# Patient Record
Sex: Female | Born: 1970 | Race: Black or African American | Hispanic: No | Marital: Single | Smoking: Former smoker
Health system: Southern US, Community
[De-identification: ages and names within clinical notes are randomized; demographics above are authoritative.]

## PROBLEM LIST (undated history)

## (undated) DIAGNOSIS — M216X9 Other acquired deformities of unspecified foot: Secondary | ICD-10-CM

## (undated) DIAGNOSIS — Z8489 Family history of other specified conditions: Secondary | ICD-10-CM

## (undated) DIAGNOSIS — J302 Other seasonal allergic rhinitis: Secondary | ICD-10-CM

## (undated) DIAGNOSIS — I10 Essential (primary) hypertension: Secondary | ICD-10-CM

## (undated) DIAGNOSIS — K219 Gastro-esophageal reflux disease without esophagitis: Secondary | ICD-10-CM

## (undated) DIAGNOSIS — R51 Headache: Secondary | ICD-10-CM

## (undated) DIAGNOSIS — G4730 Sleep apnea, unspecified: Secondary | ICD-10-CM

## (undated) DIAGNOSIS — T883XXA Malignant hyperthermia due to anesthesia, initial encounter: Secondary | ICD-10-CM

## (undated) DIAGNOSIS — F329 Major depressive disorder, single episode, unspecified: Secondary | ICD-10-CM

## (undated) DIAGNOSIS — T4145XA Adverse effect of unspecified anesthetic, initial encounter: Secondary | ICD-10-CM

## (undated) DIAGNOSIS — D219 Benign neoplasm of connective and other soft tissue, unspecified: Secondary | ICD-10-CM

## (undated) DIAGNOSIS — F32A Depression, unspecified: Secondary | ICD-10-CM

## (undated) DIAGNOSIS — M1990 Unspecified osteoarthritis, unspecified site: Secondary | ICD-10-CM

## (undated) DIAGNOSIS — T8859XA Other complications of anesthesia, initial encounter: Secondary | ICD-10-CM

## (undated) DIAGNOSIS — F419 Anxiety disorder, unspecified: Secondary | ICD-10-CM

## (undated) DIAGNOSIS — E039 Hypothyroidism, unspecified: Secondary | ICD-10-CM

## (undated) HISTORY — PX: KNEE SURGERY: SHX244

## (undated) HISTORY — DX: Other acquired deformities of unspecified foot: M21.6X9

---

## 2009-12-26 ENCOUNTER — Encounter
Admission: RE | Admit: 2009-12-26 | Discharge: 2009-12-26 | Payer: Self-pay | Source: Home / Self Care | Attending: Endocrinology | Admitting: Endocrinology

## 2010-04-18 ENCOUNTER — Emergency Department (HOSPITAL_BASED_OUTPATIENT_CLINIC_OR_DEPARTMENT_OTHER)
Admission: EM | Admit: 2010-04-18 | Discharge: 2010-04-18 | Payer: Self-pay | Source: Home / Self Care | Admitting: Emergency Medicine

## 2010-04-18 LAB — BASIC METABOLIC PANEL
Calcium: 9.1 mg/dL (ref 8.4–10.5)
GFR calc Af Amer: >60 mL/min (ref >60)
GFR calc non Af Amer: >60 mL/min (ref >60)
Potassium: 3.8 mEq/L (ref 3.5–5.1)
Sodium: 140 mEq/L (ref 135–145)

## 2010-04-18 LAB — CBC
Platelets: 336 10*3/uL (ref 150–400)
RDW: 15.2 % (ref 11.5–15.5)
WBC: 5.7 10*3/uL (ref 4.0–10.5)

## 2010-04-18 LAB — POCT CARDIAC MARKERS
CKMB, poc: <1 ng/mL — ABNORMAL LOW (ref 1.0–8.0)
Troponin i, poc: <0.05 ng/mL (ref 0.00–0.09)
Troponin i, poc: <0.05 ng/mL (ref 0.00–0.09)

## 2010-04-18 LAB — DIFFERENTIAL
Basophils Absolute: 0 10*3/uL (ref 0.0–0.1)
Basophils Relative: 0 % (ref 0–1)
Eosinophils Absolute: 0.1 10*3/uL (ref 0.0–0.7)
Eosinophils Relative: 2 % (ref 0–5)
Lymphocytes Relative: 40 % (ref 12–46)

## 2010-12-01 ENCOUNTER — Encounter: Payer: Self-pay | Admitting: *Deleted

## 2010-12-01 ENCOUNTER — Emergency Department (HOSPITAL_BASED_OUTPATIENT_CLINIC_OR_DEPARTMENT_OTHER)
Admission: EM | Admit: 2010-12-01 | Discharge: 2010-12-02 | Disposition: A | Discharge status: Home / Self Care | Payer: BC Managed Care – PPO | Attending: Emergency Medicine | Admitting: Emergency Medicine

## 2010-12-01 DIAGNOSIS — I10 Essential (primary) hypertension: Secondary | ICD-10-CM | POA: Insufficient documentation

## 2010-12-01 DIAGNOSIS — M79609 Pain in unspecified limb: Secondary | ICD-10-CM | POA: Insufficient documentation

## 2010-12-01 DIAGNOSIS — M549 Dorsalgia, unspecified: Secondary | ICD-10-CM | POA: Insufficient documentation

## 2010-12-01 DIAGNOSIS — E119 Type 2 diabetes mellitus without complications: Secondary | ICD-10-CM | POA: Insufficient documentation

## 2010-12-01 DIAGNOSIS — G4730 Sleep apnea, unspecified: Secondary | ICD-10-CM | POA: Insufficient documentation

## 2010-12-01 DIAGNOSIS — F341 Dysthymic disorder: Secondary | ICD-10-CM | POA: Insufficient documentation

## 2010-12-01 DIAGNOSIS — M5430 Sciatica, unspecified side: Secondary | ICD-10-CM | POA: Insufficient documentation

## 2010-12-01 DIAGNOSIS — M79606 Pain in leg, unspecified: Secondary | ICD-10-CM

## 2010-12-01 HISTORY — DX: Essential (primary) hypertension: I10

## 2010-12-01 HISTORY — DX: Depression, unspecified: F32.A

## 2010-12-01 HISTORY — DX: Major depressive disorder, single episode, unspecified: F32.9

## 2010-12-01 HISTORY — DX: Sleep apnea, unspecified: G47.30

## 2010-12-01 HISTORY — DX: Anxiety disorder, unspecified: F41.9

## 2010-12-01 NOTE — ED Notes (Signed)
Pt states she has a hx of problems with her left knee and leg, but has now developed pain in her right leg from the knee down x 1 week

## 2010-12-02 ENCOUNTER — Emergency Department (INDEPENDENT_AMBULATORY_CARE_PROVIDER_SITE_OTHER): Payer: BC Managed Care – PPO

## 2010-12-02 DIAGNOSIS — M545 Low back pain, unspecified: Secondary | ICD-10-CM

## 2010-12-02 DIAGNOSIS — M79609 Pain in unspecified limb: Secondary | ICD-10-CM

## 2010-12-02 MED ORDER — OXYCODONE-ACETAMINOPHEN 5-325 MG PO TABS: 1.0000 | ORAL_TABLET | Freq: Four times a day (QID) | ORAL | Status: AC | PRN

## 2010-12-02 MED ORDER — OXYCODONE-ACETAMINOPHEN 5-325 MG PO TABS
2.0000 | ORAL_TABLET | Freq: Once | ORAL | Status: AC
  Administered 2010-12-02: 2 via ORAL
  Filled 2010-12-02: qty 2

## 2010-12-02 NOTE — ED Provider Notes (Signed)
History     CSN: [id number] Arrival date & time: 12/01/2010 10:37 PM  Chief Complaint  Patient presents with  . Leg Pain   Patient is a 40 y.o. female presenting with leg pain. The history is provided by the patient.  Leg Pain  Incident onset: c/o right hip and leg pain for past several weeks. There was no injury mechanism. The pain is severe. The pain has been constant since onset. Pertinent negatives include no numbness and no loss of sensation.  pt c/o radiating pain from right low back/ buttock area down right post lat hip to lower leg for past 3 weeks. Constant. Worse  w certain positions.  Denies hx ddd. No trauma, fall or injury. No numbness/weakness. No acute or abrupt change in sumptoms but states pain not controlled w home meds. No fever or chills.  No bowel or bladder c/o.   Past Medical History  Diagnosis Date  . Diabetes mellitus   . Hypertension   . Sleep apnea   . Anxiety   . Depression     Past Surgical History  Procedure Date  . Knee surgery     History reviewed. No pertinent family history.  History  Substance Use Topics  . Smoking status: Never Smoker   . Smokeless tobacco: Not on file  . Alcohol Use: No    OB History    Grav Para Term Preterm Abortions TAB SAB Ect Mult Living                  Review of Systems  Constitutional: Negative for fever.  HENT: Negative for neck pain.   Eyes: Negative for redness.  Respiratory: Negative for shortness of breath.   Cardiovascular: Negative for chest pain.  Gastrointestinal: Negative for abdominal pain.  Genitourinary: Negative for dysuria.  Skin: Negative for rash.  Neurological: Negative for numbness and headaches.  Hematological: Does not bruise/bleed easily.  Psychiatric/Behavioral: Negative for behavioral problems.    Physical Exam  BP 129/75  Pulse 78  Temp(Src) 98.5 F (36.9 C) (Oral)  Resp 20  Ht 5\' 6"  (1.676 m)  Wt 280 lb (127.007 kg)  BMI 45.19 kg/m2  SpO2 100%  LMP  12/01/2010  Physical Exam  Nursing note and vitals reviewed. Constitutional: She is oriented to person, place, and time. She appears well-developed and well-nourished. No distress.  Eyes: Conjunctivae are normal. No scleral icterus.  Neck: Neck supple. No tracheal deviation present.  Cardiovascular: Normal rate.   Pulmonary/Chest: Effort normal. No respiratory distress.  Abdominal: Soft. Normal appearance and bowel sounds are normal. She exhibits no distension. There is no tenderness. There is no rebound and no guarding.       obese  Genitourinary:       No cva tenderness.  Musculoskeletal: She exhibits no edema.       Spine non tender. Pain/tenderness in area right sciatic notch. Straight leg raise pos on right. Distal pulses palp. Good rom at hip and knee without pain. No knee effusion. No skin changes or erythema. No sts. No calf swelling or tenderness.   Neurological: She is alert and oriented to person, place, and time. She has normal reflexes.       Motor intact. Normal reflexes  Skin: Skin is warm and dry. No rash noted.  Psychiatric: She has a normal mood and affect.    ED Course  Procedures  MDM   Pt states took no pain meds pta. Does not have to drive. Percocet po. Xr.  Dg Lumbar Spine Complete  12/02/2010  *RADIOLOGY REPORT*  Clinical Data: Low back pain  LUMBAR SPINE - COMPLETE 4+ VIEW  Comparison: None.  Findings: The imaged vertebral bodies and inter-vertebral disc spaces are maintained. No displaced acute fracture or dislocation identified.   The para-vertebral and overlying soft tissues are within normal limits.  IMPRESSION: No acute osseous abnormality.,  Original Report Authenticated By: Waneta Martins, M.D.   Dg Hip Complete Right  12/02/2010  *RADIOLOGY REPORT*  Clinical Data: Right leg pain.  RIGHT HIP - COMPLETE 2+ VIEW  Comparison: None.  Findings: No displaced acute fracture or dislocation identified. No aggressive appearing osseous lesion.  IMPRESSION:  No acute osseous abnormality.  Original Report Authenticated By: Waneta Martins, M.D.    Recheck pt comfortable.    Suzi Roots, MD 12/02/10 [phone]

## 2011-08-26 ENCOUNTER — Other Ambulatory Visit: Payer: Self-pay | Admitting: Obstetrics and Gynecology

## 2011-08-27 ENCOUNTER — Encounter (HOSPITAL_COMMUNITY): Payer: Self-pay

## 2011-08-28 ENCOUNTER — Encounter (HOSPITAL_COMMUNITY)
Admission: RE | Admit: 2011-08-28 | Discharge: 2011-08-28 | Disposition: A | Discharge status: Home / Self Care | Payer: BC Managed Care – PPO | Source: Ambulatory Visit | Attending: Obstetrics and Gynecology | Admitting: Obstetrics and Gynecology

## 2011-08-28 ENCOUNTER — Other Ambulatory Visit: Payer: Self-pay

## 2011-08-28 ENCOUNTER — Encounter (HOSPITAL_COMMUNITY): Payer: Self-pay

## 2011-08-28 HISTORY — DX: Adverse effect of unspecified anesthetic, initial encounter: T41.45XA

## 2011-08-28 HISTORY — DX: Other complications of anesthesia, initial encounter: T88.59XA

## 2011-08-28 HISTORY — DX: Malignant hyperthermia due to anesthesia, initial encounter: T88.3XXA

## 2011-08-28 HISTORY — DX: Family history of other specified conditions: Z84.89

## 2011-08-28 LAB — CBC
HCT: 37.7 % (ref 36.0–46.0)
Hemoglobin: 11.7 g/dL — ABNORMAL LOW (ref 12.0–15.0)
MCH: 24.8 pg — ABNORMAL LOW (ref 26.0–34.0)
MCHC: 31 g/dL (ref 30.0–36.0)
RDW: 14.1 % (ref 11.5–15.5)

## 2011-08-28 LAB — BASIC METABOLIC PANEL
BUN: 6 mg/dL (ref 6–23)
Calcium: 9.4 mg/dL (ref 8.4–10.5)
GFR calc Af Amer: >90 mL/min (ref >90)
GFR calc non Af Amer: >90 mL/min (ref >90)
Glucose, Bld: 102 mg/dL — ABNORMAL HIGH (ref 70–99)

## 2011-08-28 NOTE — Patient Instructions (Signed)
YOUR PROCEDURE IS SCHEDULED ON:09/05/11  ENTER THROUGH THE MAIN ENTRANCE OF [facility] AT:8am  USE DESK PHONE AND DIAL [id number] TO INFORM us OF YOUR ARRIVAL  CALL [phone] IF YOU HAVE ANY QUESTIONS OR PROBLEMS PRIOR TO YOUR ARRIVAL.  REMEMBER: DO NOT EAT OR DRINK AFTER MIDNIGHT :Thursday   YOU MAY BRUSH YOUR TEETH THE MORNING OF SURGERY   TAKE THESE MEDICINES THE DAY OF SURGERY WITH SIP OF WATER:see your list for meds checked in red---take those   DO NOT WEAR JEWELRY, EYE MAKEUP, LIPSTICK OR DARK FINGERNAIL POLISH DO NOT WEAR LOTIONS  DO NOT SHAVE FOR 48 HOURS PRIOR TO SURGERY  YOU WILL NOT BE ALLOWED TO DRIVE YOURSELF HOME.  NAME OF DRIVER:Anita Rodgers

## 2011-09-05 ENCOUNTER — Encounter (HOSPITAL_COMMUNITY): Payer: Self-pay | Admitting: Certified Registered"

## 2011-09-05 ENCOUNTER — Encounter (HOSPITAL_COMMUNITY): Payer: Self-pay | Admitting: *Deleted

## 2011-09-05 ENCOUNTER — Encounter (HOSPITAL_COMMUNITY): Payer: Self-pay

## 2011-09-05 ENCOUNTER — Ambulatory Visit (HOSPITAL_COMMUNITY): Payer: BC Managed Care – PPO | Admitting: Certified Registered"

## 2011-09-05 ENCOUNTER — Ambulatory Visit (HOSPITAL_COMMUNITY)
Admission: RE | Admit: 2011-09-05 | Discharge: 2011-09-05 | Disposition: A | Discharge status: Home / Self Care | Payer: BC Managed Care – PPO | Source: Ambulatory Visit | Attending: Obstetrics and Gynecology | Admitting: Obstetrics and Gynecology

## 2011-09-05 ENCOUNTER — Encounter (HOSPITAL_COMMUNITY)
Admission: RE | Disposition: A | Discharge status: Home / Self Care | Payer: Self-pay | Source: Ambulatory Visit | Attending: Obstetrics and Gynecology

## 2011-09-05 DIAGNOSIS — Z01812 Encounter for preprocedural laboratory examination: Secondary | ICD-10-CM | POA: Insufficient documentation

## 2011-09-05 DIAGNOSIS — N840 Polyp of corpus uteri: Secondary | ICD-10-CM | POA: Insufficient documentation

## 2011-09-05 DIAGNOSIS — Z01818 Encounter for other preprocedural examination: Secondary | ICD-10-CM | POA: Insufficient documentation

## 2011-09-05 DIAGNOSIS — N920 Excessive and frequent menstruation with regular cycle: Secondary | ICD-10-CM

## 2011-09-05 HISTORY — DX: Gastro-esophageal reflux disease without esophagitis: K21.9

## 2011-09-05 HISTORY — DX: Hypothyroidism, unspecified: E03.9

## 2011-09-05 HISTORY — DX: Unspecified osteoarthritis, unspecified site: M19.90

## 2011-09-05 HISTORY — DX: Headache: R51

## 2011-09-05 HISTORY — DX: Other seasonal allergic rhinitis: J30.2

## 2011-09-05 SURGERY — DILATATION & CURETTAGE/HYSTEROSCOPY WITH VERSAPOINT RESECTION
Anesthesia: Spinal | Site: Uterus | Wound class: Clean Contaminated

## 2011-09-05 MED ORDER — KETOROLAC TROMETHAMINE 30 MG/ML IJ SOLN
INTRAMUSCULAR | Status: AC
  Filled 2011-09-05: qty 2

## 2011-09-05 MED ORDER — FENTANYL CITRATE 0.05 MG/ML IJ SOLN
INTRAMUSCULAR | Status: AC
  Filled 2011-09-05: qty 2

## 2011-09-05 MED ORDER — DEXAMETHASONE SODIUM PHOSPHATE 10 MG/ML IJ SOLN
INTRAMUSCULAR | Status: AC
  Filled 2011-09-05: qty 1

## 2011-09-05 MED ORDER — KETOROLAC TROMETHAMINE 30 MG/ML IJ SOLN
INTRAMUSCULAR | Status: DC | PRN
  Administered 2011-09-05: 30 mg via INTRAVENOUS

## 2011-09-05 MED ORDER — MIDAZOLAM HCL 2 MG/2ML IJ SOLN
INTRAMUSCULAR | Status: AC
  Filled 2011-09-05: qty 2

## 2011-09-05 MED ORDER — EPHEDRINE SULFATE 50 MG/ML IJ SOLN
INTRAMUSCULAR | Status: DC | PRN
  Administered 2011-09-05 (×2): 5 mg via INTRAVENOUS

## 2011-09-05 MED ORDER — SODIUM CHLORIDE 0.9 % IR SOLN
Status: DC | PRN
  Administered 2011-09-05: 1

## 2011-09-05 MED ORDER — FENTANYL CITRATE 0.05 MG/ML IJ SOLN
25.0000 ug | INTRAMUSCULAR | Status: DC | PRN
  Administered 2011-09-05 (×2): 50 ug via INTRAVENOUS

## 2011-09-05 MED ORDER — KETOROLAC TROMETHAMINE 60 MG/2ML IM SOLN
INTRAMUSCULAR | Status: DC | PRN
  Administered 2011-09-05: 30 mg via INTRAMUSCULAR

## 2011-09-05 MED ORDER — EPHEDRINE 5 MG/ML INJ
INTRAVENOUS | Status: AC
  Filled 2011-09-05: qty 10

## 2011-09-05 MED ORDER — FENTANYL CITRATE 0.05 MG/ML IJ SOLN
INTRAMUSCULAR | Status: AC
  Administered 2011-09-05: 50 ug via INTRAVENOUS
  Filled 2011-09-05: qty 2

## 2011-09-05 MED ORDER — PHENYLEPHRINE HCL 10 MG/ML IJ SOLN
INTRAMUSCULAR | Status: DC | PRN
  Administered 2011-09-05: 80 ug via INTRAVENOUS

## 2011-09-05 MED ORDER — PROPOFOL 10 MG/ML IV EMUL
INTRAVENOUS | Status: AC
  Filled 2011-09-05: qty 40

## 2011-09-05 MED ORDER — LACTATED RINGERS IV SOLN
INTRAVENOUS | Status: DC
  Administered 2011-09-05: 125 mL/h via INTRAVENOUS
  Administered 2011-09-05: 10:00:00 via INTRAVENOUS

## 2011-09-05 MED ORDER — ONDANSETRON HCL 4 MG/2ML IJ SOLN
INTRAMUSCULAR | Status: AC
  Filled 2011-09-05: qty 2

## 2011-09-05 MED ORDER — CHLOROPROCAINE HCL 1 % IJ SOLN
INTRAMUSCULAR | Status: AC
  Filled 2011-09-05: qty 30

## 2011-09-05 MED ORDER — PHENYLEPHRINE 40 MCG/ML (10ML) SYRINGE FOR IV PUSH (FOR BLOOD PRESSURE SUPPORT)
PREFILLED_SYRINGE | INTRAVENOUS | Status: AC
  Filled 2011-09-05: qty 5

## 2011-09-05 MED ORDER — LIDOCAINE HCL (CARDIAC) 20 MG/ML IV SOLN
INTRAVENOUS | Status: AC
  Filled 2011-09-05: qty 5

## 2011-09-05 MED ORDER — GLYCOPYRROLATE 0.2 MG/ML IJ SOLN
INTRAMUSCULAR | Status: AC
  Filled 2011-09-05: qty 1

## 2011-09-05 MED ORDER — MIDAZOLAM HCL 5 MG/5ML IJ SOLN
INTRAMUSCULAR | Status: DC | PRN
  Administered 2011-09-05: 1 mg via INTRAVENOUS
  Administered 2011-09-05: 2 mg via INTRAVENOUS
  Administered 2011-09-05: 1 mg via INTRAVENOUS
  Administered 2011-09-05: 2 mg via INTRAVENOUS
  Administered 2011-09-05: 1 mg via INTRAVENOUS
  Administered 2011-09-05: 2 mg via INTRAVENOUS
  Administered 2011-09-05: 1 mg via INTRAVENOUS

## 2011-09-05 SURGICAL SUPPLY — 17 items
CANISTER SUCTION 2500CC (MISCELLANEOUS) ×2 IMPLANT
CATH ROBINSON RED A/P 16FR (CATHETERS) ×2 IMPLANT
CLOTH BEACON ORANGE TIMEOUT ST (SAFETY) ×2 IMPLANT
CONTAINER PREFILL 10% NBF 60ML (FORM) ×4 IMPLANT
ELECT REM PT RETURN 9FT ADLT (ELECTROSURGICAL) ×2
ELECTRODE REM PT RTRN 9FT ADLT (ELECTROSURGICAL) ×1 IMPLANT
ELECTRODE ROLLER VERSAPOINT (ELECTRODE) IMPLANT
ELECTRODE RT ANGLE VERSAPOINT (CUTTING LOOP) ×2 IMPLANT
GLOVE BIO SURGEON STRL SZ 6.5 (GLOVE) ×2 IMPLANT
GLOVE BIOGEL PI IND STRL 7.0 (GLOVE) ×2 IMPLANT
GLOVE BIOGEL PI INDICATOR 7.0 (GLOVE) ×2
GOWN PREVENTION PLUS LG XLONG (DISPOSABLE) ×4 IMPLANT
GOWN STRL REIN XL XLG (GOWN DISPOSABLE) ×2 IMPLANT
LOOP ANGLED CUTTING 22FR (CUTTING LOOP) IMPLANT
PACK HYSTEROSCOPY LF (CUSTOM PROCEDURE TRAY) ×2 IMPLANT
TOWEL OR 17X24 6PK STRL BLUE (TOWEL DISPOSABLE) ×4 IMPLANT
WATER STERILE IRR 1000ML POUR (IV SOLUTION) ×2 IMPLANT

## 2011-09-05 NOTE — Anesthesia Postprocedure Evaluation (Signed)
Anesthesia Post Note  Patient: Anita Rodgers  Procedure(s) Performed: Procedure(s) (LRB): DILATATION & CURETTAGE/HYSTEROSCOPY WITH VERSAPOINT RESECTION (N/A)  Anesthesia type: Spinal  Patient location: PACU  Post pain: Pain level controlled  Post assessment: Post-op Vital signs reviewed  Last Vitals:  Filed Vitals:   09/05/11 1416  BP: 126/69  Pulse: 72  Temp: 36.5 C  Resp: 20    Post vital signs: Reviewed  Level of consciousness: awake  Complications: No apparent anesthesia complications

## 2011-09-05 NOTE — Anesthesia Preprocedure Evaluation (Addendum)
Anesthesia Evaluation    Airway       Dental   Pulmonary          Cardiovascular hypertension (Hx of LVH per echo, but EKG nl with no LVH), Pt. on medications     Neuro/Psych    GI/Hepatic   Endo/Other  Diabetes mellitus-, Type 2  Renal/GU      Musculoskeletal   Abdominal   Peds  Hematology   Anesthesia Other Findings   Reproductive/Obstetrics                           Anesthesia Physical Anesthesia Plan  ASA: III  Anesthesia Plan: Spinal   Post-op Pain Management:    Induction:   Airway Management Planned:   Additional Equipment:   Intra-op Plan:   Post-operative Plan:   Informed Consent: I have reviewed the patients History and Physical, chart, labs and discussed the procedure including the risks, benefits and alternatives for the proposed anesthesia with the patient or authorized representative who has indicated his/her understanding and acceptance.   Dental Advisory Given  Plan Discussed with: CRNA  Anesthesia Plan Comments: (Lab work confirmed with CRNA in room. Platelets okay. Discussed spinal anesthetic, and patient consents to the procedure:  included risk of possible headache,backache, failed block, allergic reaction, and nerve injury. This patient was asked if she had any questions or concerns before the procedure started. )        Anesthesia Quick Evaluation

## 2011-09-05 NOTE — Anesthesia Procedure Notes (Signed)

## 2011-09-05 NOTE — Discharge Instructions (Signed)
CALL  IF TEMP>100.4, NOTHING PER VAGINA X 2 WK, CALL IF SOAKING A MAXI  PAD EVERY HOUR OR MORE FREQUENTLY DISCHARGE INSTRUCTIONS: HYSTEROSCOPY / ENDOMETRIAL ABLATION The following instructions have been prepared to help you care for yourself upon your return home.  Personal hygiene: . Use sanitary pads for vaginal drainage, not tampons. . Shower the day after your procedure. . NO tub baths, pools or Jacuzzis for 2-3 weeks. . Wipe front to back after using the bathroom.  Activity and limitations: . Do NOT drive or operate any equipment for 24 hours. The effects of anesthesia are still present and drowsiness may result. . Do NOT rest in bed all day. . Walking is encouraged. . Walk up and down stairs slowly. . You may resume your normal activity in one to two days or as indicated by your physician. Sexual activity: NO intercourse for at least 2 weeks after the procedure, or as indicated by your Doctor.  Diet: Eat a light meal as desired this evening. You may resume your usual diet tomorrow.  Return to Work: You may resume your work activities in one to two days or as indicated by your Doctor.  What to expect after your surgery: Expect to have vaginal bleeding/discharge for 2-3 days and spotting for up to 10 days. It is not unusual to have soreness for up to 1-2 weeks. You may have a slight burning sensation when you urinate for the first day. Mild cramps may continue for a couple of days. You may have a regular period in 2-6 weeks.  Call your doctor for any of the following: . Excessive vaginal bleeding or clotting, saturating and changing one pad every hour. . Inability to urinate 6 hours after discharge from hospital. . Pain not relieved by pain medication. . Fever of 100.4 F or greater. . Unusual vaginal discharge or odor.  Return to office _________________Call for an appointment ___________________ Patient's signature: ______________________ Nurse's signature  ________________________  Post Anesthesia Care Unit [phone]  

## 2011-09-05 NOTE — Brief Op Note (Signed)
09/05/2011  10:21 AM  PATIENT:  Anita Rodgers  41 y.o. female  PRE-OPERATIVE DIAGNOSIS:  Submucosal Fibroid, Menorrhagia  POST-OPERATIVE DIAGNOSIS:  Endometrial polyps, Menorrhagia  PROCEDURE:  Procedure(s) (LRB): DILATATION & CURETTAGE/ DIAGNOSTIC HYSTEROSCOPY, RESECTION  WITH VERSAPOINT RESECTION (N/A)  SURGEON:  Surgeon(s) and Role:    * Derricka Mertz A Chaselyn Nanney, MD - Primary  PHYSICIAN ASSISTANT:   ASSISTANTS: none   ANESTHESIA:   spinal Findings: no evidence of SM fibroid, tubal ostia seen, small endom polyps noted, neg endocervical canal  EBL:  Total I/O In: 1300 [I.V.:1300] Out: 105 [Urine:100; Blood:5]  BLOOD ADMINISTERED:none  DRAINS: none   LOCAL MEDICATIONS USED:  NONE  SPECIMEN:  Source of Specimen:  EMC WITH POLYP  DISPOSITION OF SPECIMEN:  PATHOLOGY  COUNTS:  YES  TOURNIQUET:  * No tourniquets in log *  DICTATION: .Other Dictation: Dictation Number   PLAN OF CARE: Discharge to home after PACU  PATIENT DISPOSITION:  PACU - hemodynamically stable.   Delay start of Pharmacological VTE agent (>24hrs) due to surgical blood loss or risk of bleeding: no  in in an in and you in and is

## 2011-09-05 NOTE — Op Note (Signed)
Anita Rodgers, Anita Rodgers                 ACCOUNT NO.:  [id number]  MEDICAL RECORD NO.:  [id number]  LOCATION:  [facility]                          FACILITY:  [facility]  PHYSICIAN:  Maxie Better, M.D.DATE OF BIRTH:  08-16-1970  DATE OF PROCEDURE:  09/05/2011 DATE OF DISCHARGE:  09/05/2011                              OPERATIVE REPORT   PREOPERATIVE DIAGNOSES:  Menorrhagia, submucosal fibroid.  POSTOPERATIVE DIAGNOSES:  Menorrhagia, endometrial polyp.  PROCEDURE:  Diagnostic hysteroscopy, hysteroscopic resection of endometrial polyp, D and C.  ANESTHESIA:  Spinal.  SURGEON:  Maxie Better, MD  ASSISTANT:  None.  PROCEDURE:  Under adequate spinal anesthesia, the patient was placed in the dorsal lithotomy position.  She was sterilely prepped and draped in usual fashion.  Bladder was catheterized with moderate amount of urine. Examination under anesthesia revealed anteverted uterus.  No adnexal masses could be appreciated.  A bivalve speculum was placed in the vagina.  Single-tooth tenaculum was placed on the anterior lip of the cervix.  The cervix was serially dilated up to a #29 Pratt dilator.  A resectoscope with the Versapoint loop was introduced into the uterine cavity without incident.  Both tubal ostia could be seen.  Using variation in the pressure from 60-100, no submucosal fibroid was noted. Two polypoid lesions were seen, which were removed.  The cavity was inspected with the loop, again no submucosal fibroid was noted.  The resectoscope was removed.  The cavity was gently curetted, no irregularities noted at that time.  At that point, the resectoscope was reinserted.  The cavity was once again inspected, no lesions were noted. The resectoscope was removed.  The cavity was then once again curetted. Then, all instruments were then removed from the vagina.  SPECIMENS:  Endometrial curetting with polyps sent to pathology.  ESTIMATED BLOOD LOSS:  Minimal.  FLUID DEFICIT:  100  mL.  COMPLICATIONS:  None.  The patient tolerated the procedure well, was transferred to the recovery room in stable condition.     Maxie Better, M.D.     [place]/MEDQ  D:  09/05/2011  T:  09/05/2011  Job:  [id number]

## 2011-09-05 NOTE — Transfer of Care (Signed)
Immediate Anesthesia Transfer of Care Note  Patient: Anita Rodgers  Procedure(s) Performed: Procedure(s) (LRB): DILATATION & CURETTAGE/HYSTEROSCOPY WITH VERSAPOINT RESECTION (N/A)  Patient Location: PACU  Anesthesia Type: Spinal  Level of Consciousness: awake, alert  and oriented  Airway & Oxygen Therapy: Patient Spontanous Breathing and Patient connected to nasal cannula oxygen  Post-op Assessment: Report given to PACU RN and Post -op Vital signs reviewed and stable  Post vital signs: Reviewed and stable  Complications: No apparent anesthesia complications

## 2011-10-14 ENCOUNTER — Other Ambulatory Visit: Payer: Self-pay | Admitting: Obstetrics and Gynecology

## 2011-10-15 ENCOUNTER — Other Ambulatory Visit: Payer: Self-pay | Admitting: Obstetrics and Gynecology

## 2011-10-16 ENCOUNTER — Other Ambulatory Visit (HOSPITAL_COMMUNITY): Payer: Self-pay | Admitting: *Deleted

## 2011-10-17 ENCOUNTER — Encounter (HOSPITAL_COMMUNITY): Payer: Self-pay

## 2011-10-17 ENCOUNTER — Encounter (HOSPITAL_COMMUNITY)
Admission: RE | Admit: 2011-10-17 | Discharge: 2011-10-17 | Disposition: A | Discharge status: Home / Self Care | Payer: BC Managed Care – PPO | Source: Ambulatory Visit | Attending: Obstetrics and Gynecology | Admitting: Obstetrics and Gynecology

## 2011-10-17 DIAGNOSIS — D649 Anemia, unspecified: Secondary | ICD-10-CM | POA: Insufficient documentation

## 2011-10-17 MED ORDER — SODIUM CHLORIDE 0.9 % IV SOLN
1020.0000 mg | Freq: Once | INTRAVENOUS | Status: AC
  Administered 2011-10-17: 1020 mg via INTRAVENOUS
  Filled 2011-10-17: qty 34

## 2011-10-17 MED ORDER — SODIUM CHLORIDE 0.9 % IV SOLN
Freq: Once | INTRAVENOUS | Status: AC
  Administered 2011-10-17: 11:00:00 via INTRAVENOUS

## 2012-02-04 ENCOUNTER — Other Ambulatory Visit (HOSPITAL_BASED_OUTPATIENT_CLINIC_OR_DEPARTMENT_OTHER): Payer: Self-pay | Admitting: Internal Medicine

## 2012-02-04 DIAGNOSIS — R51 Headache: Secondary | ICD-10-CM

## 2012-02-07 ENCOUNTER — Ambulatory Visit (HOSPITAL_BASED_OUTPATIENT_CLINIC_OR_DEPARTMENT_OTHER): Payer: BC Managed Care – PPO

## 2012-02-10 ENCOUNTER — Ambulatory Visit (HOSPITAL_BASED_OUTPATIENT_CLINIC_OR_DEPARTMENT_OTHER)
Admission: RE | Admit: 2012-02-10 | Discharge: 2012-02-10 | Disposition: A | Discharge status: Home / Self Care | Payer: BC Managed Care – PPO | Source: Ambulatory Visit | Attending: Internal Medicine | Admitting: Internal Medicine

## 2012-02-10 DIAGNOSIS — R209 Unspecified disturbances of skin sensation: Secondary | ICD-10-CM | POA: Insufficient documentation

## 2012-02-10 DIAGNOSIS — R51 Headache: Secondary | ICD-10-CM

## 2012-02-10 DIAGNOSIS — R29898 Other symptoms and signs involving the musculoskeletal system: Secondary | ICD-10-CM | POA: Insufficient documentation

## 2012-02-10 DIAGNOSIS — R55 Syncope and collapse: Secondary | ICD-10-CM | POA: Insufficient documentation

## 2012-02-10 DIAGNOSIS — R262 Difficulty in walking, not elsewhere classified: Secondary | ICD-10-CM | POA: Insufficient documentation

## 2012-07-01 ENCOUNTER — Ambulatory Visit: Payer: Self-pay | Admitting: Podiatry

## 2012-07-01 ENCOUNTER — Encounter: Payer: Self-pay | Admitting: Podiatry

## 2012-09-28 ENCOUNTER — Ambulatory Visit: Payer: BC Managed Care – PPO | Admitting: *Deleted

## 2012-10-03 ENCOUNTER — Emergency Department (HOSPITAL_BASED_OUTPATIENT_CLINIC_OR_DEPARTMENT_OTHER): Payer: BC Managed Care – PPO

## 2012-10-03 ENCOUNTER — Encounter (HOSPITAL_BASED_OUTPATIENT_CLINIC_OR_DEPARTMENT_OTHER): Payer: Self-pay | Admitting: *Deleted

## 2012-10-03 ENCOUNTER — Emergency Department (HOSPITAL_BASED_OUTPATIENT_CLINIC_OR_DEPARTMENT_OTHER)
Admission: EM | Admit: 2012-10-03 | Discharge: 2012-10-04 | Disposition: A | Discharge status: Home / Self Care | Payer: BC Managed Care – PPO | Attending: Emergency Medicine | Admitting: Emergency Medicine

## 2012-10-03 DIAGNOSIS — M79609 Pain in unspecified limb: Secondary | ICD-10-CM | POA: Insufficient documentation

## 2012-10-03 DIAGNOSIS — E119 Type 2 diabetes mellitus without complications: Secondary | ICD-10-CM | POA: Insufficient documentation

## 2012-10-03 DIAGNOSIS — M79604 Pain in right leg: Secondary | ICD-10-CM

## 2012-10-03 DIAGNOSIS — F3289 Other specified depressive episodes: Secondary | ICD-10-CM | POA: Insufficient documentation

## 2012-10-03 DIAGNOSIS — Z8739 Personal history of other diseases of the musculoskeletal system and connective tissue: Secondary | ICD-10-CM | POA: Insufficient documentation

## 2012-10-03 DIAGNOSIS — M25579 Pain in unspecified ankle and joints of unspecified foot: Secondary | ICD-10-CM | POA: Insufficient documentation

## 2012-10-03 DIAGNOSIS — E039 Hypothyroidism, unspecified: Secondary | ICD-10-CM | POA: Insufficient documentation

## 2012-10-03 DIAGNOSIS — I10 Essential (primary) hypertension: Secondary | ICD-10-CM | POA: Insufficient documentation

## 2012-10-03 DIAGNOSIS — F329 Major depressive disorder, single episode, unspecified: Secondary | ICD-10-CM | POA: Insufficient documentation

## 2012-10-03 DIAGNOSIS — Z87891 Personal history of nicotine dependence: Secondary | ICD-10-CM | POA: Insufficient documentation

## 2012-10-03 DIAGNOSIS — K219 Gastro-esophageal reflux disease without esophagitis: Secondary | ICD-10-CM | POA: Insufficient documentation

## 2012-10-03 DIAGNOSIS — F411 Generalized anxiety disorder: Secondary | ICD-10-CM | POA: Insufficient documentation

## 2012-10-03 NOTE — ED Notes (Signed)
Pt states she awoke 3 days ago with right foot swelling. Unknown cause. Denies injury. Swelling noted with redness to right outer foot area. Denies hx of arthritis or gout. Able to walk but painful.

## 2012-10-04 ENCOUNTER — Emergency Department (HOSPITAL_BASED_OUTPATIENT_CLINIC_OR_DEPARTMENT_OTHER): Payer: BC Managed Care – PPO

## 2012-10-04 ENCOUNTER — Ambulatory Visit (HOSPITAL_BASED_OUTPATIENT_CLINIC_OR_DEPARTMENT_OTHER)
Admit: 2012-10-04 | Discharge: 2012-10-04 | Disposition: A | Discharge status: Home / Self Care | Payer: BC Managed Care – PPO | Attending: Emergency Medicine | Admitting: Emergency Medicine

## 2012-10-04 LAB — D-DIMER, QUANTITATIVE: D-Dimer, Quant: 0.3 ug/mL-FEU (ref 0.00–0.48)

## 2012-10-04 MED ORDER — HYDROCODONE-ACETAMINOPHEN 5-325 MG PO TABS: 1.0000 | ORAL_TABLET | Freq: Four times a day (QID) | ORAL | Status: DC | PRN

## 2012-10-04 MED ORDER — HYDROCODONE-ACETAMINOPHEN 5-325 MG PO TABS
ORAL_TABLET | ORAL | Status: AC
  Administered 2012-10-04: 1
  Filled 2012-10-04: qty 1

## 2012-10-04 NOTE — ED Notes (Signed)
Adam at bedside to schedule u/s for today

## 2012-10-04 NOTE — ED Notes (Signed)
Placed call to pt to report negative ultrasound results and to follow up with PCP per verbal order from Langston Masker, [place]. Pt agrees with plan of care.

## 2012-10-04 NOTE — ED Provider Notes (Signed)
History  This chart was scribed for Hanley Seamen, MD, by Yevette Edwards, ED Scribe. This patient was seen in room MH03/MH03 and the patient's care was started at 12:15 AM.  CSN: [id number] Arrival date & time 10/03/12  2129  First MD Initiated Contact with Patient 10/04/12 0016     Chief Complaint  Patient presents with  . Foot Pain    The history is provided by the patient. No language interpreter was used.   HPI Comments: Anita Rodgers is a 42 y.o. female, with a h/o of DM, presents to the Emergency Department complaining of pain to the dorsal lateral aspect of her right foot and ankle. The onset of the pain was three days ago, and the pt reports it has been constant since onset. She states the pain is a 10/10 and is exacerbated with exertion.  She denies experiencing any fever or chills with it. She also denies any known injuries to her foot, diabetic neuropathy in her feet, and any recent prolonged car or airplane trips. She has a h/o of HTN and arthritis.  The pt reports she is a former smoker, and she denies using alcohol.   Past Medical History  Diagnosis Date  . Diabetes mellitus   . Hypertension   . Sleep apnea   . Anxiety   . Depression   . Complication of anesthesia     pt states she has malignant hyperthermia  . GERD (gastroesophageal reflux disease)   . Hypothyroidism   . Headache(784.0)   . Arthritis     knees  . Seasonal allergies   . SVD (spontaneous vaginal delivery)     x 1  . Family history of anesthesia complication     malignant hyperthermia  . 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 Equinus deformity of foot     Bilateral   Past Surgical History  Procedure Laterality Date  . Knee surgery      x 2 left   No family history on file. History  Substance Use Topics  . Smoking status: Former Smoker -- 0.50 packs/day for 3 years    Types: Cigarettes    Quit date: 03/24/1998  . Smokeless tobacco: Never Used  . Alcohol Use: No   No OB  history provided.   Review of Systems  Constitutional: Negative for fever and chills.  Musculoskeletal: Positive for arthralgias.  All other systems reviewed and are negative.    Allergies  Review of patient's allergies indicates no known allergies.  Home Medications   Current Outpatient Rx  Name  Route  Sig  Dispense  Refill  . acetaminophen (TYLENOL) 500 MG tablet   Oral   Take 1,000 mg by mouth 2 (two) times daily as needed. For pain           . ALPRAZolam (XANAX) 0.5 MG tablet   Oral   Take 0.5 mg by mouth every 12 (twelve) hours as needed. For anxiety          . amitriptyline (ELAVIL) 25 MG tablet   Oral   Take 25 mg by mouth at bedtime.           Marland Kitchen amLODipine-valsartan (EXFORGE) 5-320 MG per tablet   Oral   Take 1 tablet by mouth daily.           . celecoxib (CELEBREX) 200 MG capsule   Oral   Take 200 mg by mouth every 12 (twelve) hours.           Marland Kitchen  ciprofloxacin (CIPRO) 500 MG tablet   Oral   Take 500 mg by mouth 2 (two) times daily. Pt is on day 2 of a seven day course of medication.         Marland Kitchen doxycycline (VIBRA-TABS) 100 MG tablet   Oral   Take 100 mg by mouth daily.           . ergocalciferol (VITAMIN D2) 50000 UNITS capsule   Oral   Take 50,000 Units by mouth once a week.          . Escitalopram Oxalate (LEXAPRO PO)   Oral   Take 10 mg by mouth daily.          . ferrous sulfate 325 (65 FE) MG tablet   Oral   Take 325 mg by mouth daily with breakfast.         . fluticasone (FLONASE) 50 MCG/ACT nasal spray   Nasal   Place 1 spray into the nose daily.           . hydrochlorothiazide 25 MG tablet   Oral   Take 25 mg by mouth daily.           Marland Kitchen HYDROmorphone HCl (EXALGO) 8 MG TB24   Oral   Take 16 mg by mouth 2 (two) times daily.         Marland Kitchen ipratropium (ATROVENT) 0.06 % nasal spray   Nasal   Place 1 spray into the nose 2 (two) times daily.           Marland Kitchen levothyroxine (SYNTHROID, LEVOTHROID) 50 MCG tablet   Oral    Take 50 mcg by mouth daily.         Marland Kitchen loratadine (CLARITIN) 10 MG tablet   Oral   Take 10 mg by mouth daily.           . metFORMIN (GLUCOPHAGE-XR) 500 MG 24 hr tablet   Oral   Take 500 mg by mouth 2 (two) times daily.           . montelukast (SINGULAIR) 10 MG tablet   Oral   Take 10 mg by mouth at bedtime.           . Multiple Vitamin (MULITIVITAMIN WITH MINERALS) TABS   Oral   Take 1 tablet by mouth daily.         . Nebivolol HCl (BYSTOLIC) 20 MG TABS   Oral   Take 1 tablet by mouth at bedtime.          Marland Kitchen omeprazole (PRILOSEC) 40 MG capsule   Oral   Take 40 mg by mouth daily.         Bertram Gala Glycol-Propyl Glycol (SYSTANE ULTRA) 0.4-0.3 % SOLN   Both Eyes   Place 1 drop into both eyes daily as needed. For dry eyes         . progesterone (PROMETRIUM) 200 MG capsule   Oral   Take 200 mg by mouth at bedtime.          . promethazine (PHENERGAN) 25 MG tablet   Oral   Take 25 mg by mouth every 6 (six) hours as needed. For nausea.         . SUMAtriptan (IMITREX) 50 MG tablet   Oral   Take 50 mg by mouth once as needed. For migraine          . venlafaxine (EFFEXOR-XR) 75 MG 24 hr capsule   Oral   Take 75 mg by mouth  every 12 (twelve) hours.            Triage Vitals: BP 157/90  Pulse 104  Temp(Src) 98.6 F (37 C) (Oral)  Resp 23  Ht 5' 6.5" (1.689 m)  Wt 300 lb (136.079 kg)  BMI 47.7 kg/m2  SpO2 97%  Physical Exam  Nursing note and vitals reviewed. Constitutional: She is oriented to person, place, and time. She appears well-developed and well-nourished. No distress.  HENT:  Head: Normocephalic and atraumatic.  Eyes: Conjunctivae and EOM are normal. Pupils are equal, round, and reactive to light.  Neck: Neck supple. No tracheal deviation present.  Cardiovascular: Normal rate, regular rhythm and normal heart sounds.   Pulmonary/Chest: Effort normal and breath sounds normal. No respiratory distress. She has no wheezes.  Abdominal:  Soft.  Musculoskeletal: Normal range of motion.  Pain and swelling to dorsal lateral aspect of right foot and ankle.  Slight erythema associated with the pain and swelling, but no appreciable warmth.  Right calf is more swollen than left calf.  Neurological: She is alert and oriented to person, place, and time.  Skin: Skin is warm and dry.  Psychiatric: She has a normal mood and affect. Her behavior is normal.    ED Course  Procedures (including critical care time)  DIAGNOSTIC STUDIES: Oxygen Saturation is 97% on room air, normal by my interpretation.    COORDINATION OF CARE:  12:19 AM- Discussed treatment plan with patient which includes imaging and blood work, and the patient agreed to the plan.    MDM   Nursing notes and vitals signs, including pulse oximetry, reviewed.  Summary of this visit's results, reviewed by myself:  Labs:  Results for orders placed during the hospital encounter of 10/03/12 (from the past 24 hour(s))  D-DIMER, QUANTITATIVE     Status: None   Collection Time    10/04/12 12:21 AM      Result Value Range   D-Dimer, Quant 0.30  0.00 - 0.48 ug/mL-FEU    Imaging Studies: Dg Ankle Complete Right  10/04/2012   *RADIOLOGY REPORT*  Clinical Data: Ankle pain.  RIGHT ANKLE - COMPLETE 3+ VIEW  Comparison: Foot 10/03/2012  Findings: Three views of the right ankle were obtained. There is concern for lateral soft tissue swelling.  The right ankle is located.  No evidence for acute fracture.  IMPRESSION: No acute bony abnormality.   Original Report Authenticated By: Richarda Overlie, M.D.   Dg Foot Complete Right  10/04/2012   *RADIOLOGY REPORT*  Clinical Data: Dorsal right foot pain and swelling.  RIGHT FOOT COMPLETE - 3+ VIEW  Comparison: None.  Findings: There is no evidence of fracture or dislocation.  The joint spaces are preserved.  There is no evidence of talar subluxation; the subtalar joint is unremarkable in appearance.  An os peroneum is noted.  A posterior  calcaneal spur is seen.  Diffuse dorsal soft tissue swelling is noted.  IMPRESSION:  1.  No evidence of fracture or dislocation. 2.  Diffuse dorsal soft tissue swelling noted. 3.  Os peroneum noted.   Original Report Authenticated By: Tonia Ghent, M.D.   1:27 AM Because of the pain in her leg is not immediately obvious. We'll obtain a Doppler ultrasound later today to evaluate for possible DVT. We will not give Lovenox as her d-dimer is within normal limits.   I personally performed the services described in this documentation, which was scribed in my presence.  The recorded information has been reviewed and is accurate.  Hanley Seamen, MD 10/04/12 [phone]

## 2013-01-04 ENCOUNTER — Ambulatory Visit: Payer: BC Managed Care – PPO | Admitting: Internal Medicine

## 2013-01-19 ENCOUNTER — Ambulatory Visit (INDEPENDENT_AMBULATORY_CARE_PROVIDER_SITE_OTHER): Payer: BC Managed Care – PPO | Admitting: Internal Medicine

## 2013-01-19 ENCOUNTER — Encounter: Payer: Self-pay | Admitting: Internal Medicine

## 2013-01-19 VITALS — BP 106/80 | HR 99 | Ht 66.0 in | Wt 297.1 lb

## 2013-01-19 DIAGNOSIS — Z789 Other specified health status: Secondary | ICD-10-CM

## 2013-01-19 DIAGNOSIS — E119 Type 2 diabetes mellitus without complications: Secondary | ICD-10-CM

## 2013-01-19 DIAGNOSIS — I10 Essential (primary) hypertension: Secondary | ICD-10-CM

## 2013-01-19 DIAGNOSIS — E785 Hyperlipidemia, unspecified: Secondary | ICD-10-CM

## 2013-01-19 DIAGNOSIS — R609 Edema, unspecified: Secondary | ICD-10-CM

## 2013-01-19 DIAGNOSIS — R079 Chest pain, unspecified: Secondary | ICD-10-CM | POA: Insufficient documentation

## 2013-01-19 DIAGNOSIS — R0600 Dyspnea, unspecified: Secondary | ICD-10-CM

## 2013-01-19 DIAGNOSIS — R600 Localized edema: Secondary | ICD-10-CM

## 2013-01-19 DIAGNOSIS — R0609 Other forms of dyspnea: Secondary | ICD-10-CM

## 2013-01-19 DIAGNOSIS — Z7289 Other problems related to lifestyle: Secondary | ICD-10-CM

## 2013-01-19 DIAGNOSIS — G4733 Obstructive sleep apnea (adult) (pediatric): Secondary | ICD-10-CM | POA: Insufficient documentation

## 2013-01-19 NOTE — Progress Notes (Signed)
OFFICE NOTE  Chief Complaint:  Chest pain, dyspnea  Primary Care Physician: Caffie Damme, MD  HPI:  Anita Rodgers is a 42 year old female with a long-standing history of diabetes, recent 100 pound weight gain over the past 6 or 7 years, chronic knee pain with surgeries that cause her to be disabled from her job. Just is on chronic pain medication for this. Recently she's been having problems with lower extremities swelling as well as chest pain and shortness of breath with exertion. She described a recent episode where she felt chest tightness while walking up stairs. She does have difficulty doing this because of her knee problems. Affect is wearing a brace on her left knee today. Just reports she's been having swelling from time to time in the lower extremities in which they are massively swollen. She does say that the Lasix helped somewhat with this. With regards to her shortness of breath this has been worsening but may be associated with a 100 pound weight gain and the fact that she is not physically active. She does do some water aerobics and recently has tried to change her diet for weight loss. However during her water aerobics, there is little resistance that is performed and her heart rate is not necessarily getting elevated with exercise. She is referred for possible they have coronary disease. Of note she saw a cardiology physician assistant at [facility] several years ago and underwent an echocardiogram through [facility] in July of 2014. This showed moderate concentric LVH and apparently normal diastolic filling, however that is of course impossible.  PMHx:  Past Medical History  Diagnosis Date  . Diabetes mellitus   . Hypertension   . Sleep apnea     on CPAP  . Anxiety   . Depression   . Complication of anesthesia     pt states she has malignant hyperthermia  . GERD (gastroesophageal reflux disease)   . Hypothyroidism   . Headache(784.0)   .  Arthritis     knees  . Seasonal allergies   . SVD (spontaneous vaginal delivery)     x 1  . Family history of anesthesia complication     malignant hyperthermia  . 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 Equinus deformity of foot     Bilateral    Past Surgical History  Procedure Laterality Date  . Knee surgery      x 2 left    FAMHx:  Family History  Problem Relation Age of Onset  . Hypertension Mother   . Hyperlipidemia Mother   . Depression Mother   . Hypertension Father   . Diabetes Father   . Hypertension Maternal Grandmother   . Diabetes Maternal Grandmother   . Stroke Maternal Grandmother   . Heart disease Maternal Grandmother   . Prostate cancer Maternal Grandfather   . Uterine cancer Paternal Grandmother   . Cervical cancer Paternal Grandmother   . Hypertension Sister     SOCHx:   reports that she quit smoking about 14 years ago. Her smoking use included Cigarettes. She has a 1.5 pack-year smoking history. She has never used smokeless tobacco. She reports that she does not drink alcohol or use illicit drugs.  ALLERGIES:  No Known Allergies  ROS: A comprehensive review of systems was negative except for: Constitutional: positive for fatigue Respiratory: positive for dyspnea on exertion Cardiovascular: positive for exertional chest pressure/discomfort and lower extremity edema Musculoskeletal: positive for bilateral knee  pain - chronic  HOME MEDS: Current Outpatient Prescriptions  Medication Sig Dispense Refill  . acetaminophen (TYLENOL) 500 MG tablet Take 1,000 mg by mouth 2 (two) times daily as needed. For pain        . ALPRAZolam (XANAX) 0.5 MG tablet Take 0.5 mg by mouth every 12 (twelve) hours as needed. For anxiety       . amitriptyline (ELAVIL) 25 MG tablet Take 25 mg by mouth at bedtime.       Marland Kitchen amLODipine-valsartan (EXFORGE) 5-320 MG per tablet Take 1 tablet by mouth daily.        Marland Kitchen doxycycline (VIBRA-TABS) 100 MG tablet Take 100 mg  by mouth daily.        . Escitalopram Oxalate (LEXAPRO PO) Take 10 mg by mouth daily.       . ferrous sulfate 325 (65 FE) MG tablet Take 325 mg by mouth daily with breakfast.      . fluticasone (FLONASE) 50 MCG/ACT nasal spray Place 1 spray into the nose daily.        . furosemide (LASIX) 40 MG tablet Take 40 mg by mouth 2 (two) times daily.      Marland Kitchen HYDROmorphone (DILAUDID) 4 MG tablet Take 4 mg by mouth every 6 (six) hours as needed for pain.      Marland Kitchen HYDROmorphone HCl (EXALGO) 8 MG T24A SR tablet Take 24 mg by mouth daily.      Marland Kitchen ipratropium (ATROVENT) 0.06 % nasal spray Place 1 spray into the nose 2 (two) times daily.        Marland Kitchen levothyroxine (SYNTHROID, LEVOTHROID) 50 MCG tablet Take 50 mcg by mouth daily.      Marland Kitchen loratadine (CLARITIN) 10 MG tablet Take 10 mg by mouth daily.        . metFORMIN (GLUCOPHAGE-XR) 500 MG 24 hr tablet Take 1,000 mg by mouth 2 (two) times daily.       . montelukast (SINGULAIR) 10 MG tablet Take 10 mg by mouth at bedtime.        . Multiple Vitamin (MULITIVITAMIN WITH MINERALS) TABS Take 1 tablet by mouth daily.      . Nebivolol HCl (BYSTOLIC) 20 MG TABS Take 1 tablet by mouth at bedtime.       . NON FORMULARY Take 4 tablets by mouth daily. Omega XL      . NON FORMULARY Take 1 capsule by mouth 2 (two) times daily. ProbioSlim      . NON FORMULARY at bedtime. CPAP      . omeprazole (PRILOSEC) 40 MG capsule Take 40 mg by mouth daily.      Bertram Gala Glycol-Propyl Glycol (SYSTANE ULTRA) 0.4-0.3 % SOLN Place 1 drop into both eyes daily as needed. For dry eyes      . potassium chloride SA (K-DUR,KLOR-CON) 20 MEQ tablet Take 20 mEq by mouth daily.      . progesterone (PROMETRIUM) 200 MG capsule Take 200 mg by mouth at bedtime.       . SUMAtriptan (IMITREX) 50 MG tablet Take 50 mg by mouth once as needed. For migraine        No current facility-administered medications for this visit.    LABS/IMAGING: No results found for this or any previous visit (from the past 48  hour(s)). No results found.  VITALS: BP 106/80  Pulse 99  Ht 5\' 6"  (1.676 m)  Wt 297 lb 1.6 oz (134.764 kg)  BMI 47.98 kg/m2  EXAM: General appearance: alert, no distress  and significant facial acne Neck: no carotid bruit and no JVD Lungs: clear to auscultation bilaterally Heart: regular rate and rhythm, S1, S2 normal, no murmur, click, rub or gallop Abdomen: soft, non-tender; bowel sounds normal; no masses,  no organomegaly and morbidly obese, no fluid wave Extremities: no edema, no varicose veins, knee brace on the left knee Pulses: 2+ and symmetric Skin: Skin color, texture, turgor normal. No rashes or lesions Neurologic: Mental status: Alert, oriented, thought content appropriate Psych: Mood, affect normal, appropriate  EKG: Normal sinus rhythm at 99, nonspecific T wave changes  ASSESSMENT: 1. Chest pain and worsening shortness of breath with exertion 2. Morbid obesity 3. Obstructive sleep apnea and CPAP 4. Type 2 diabetes - recent A1c 7.4 5. Dyslipidemia 6. Hypertension  PLAN: 1.   Mrs. Ronelle Nigh has had some worsening shortness of breath and chest discomfort. She is desiring to start doing more exercise to work on weight loss. She does have numerous cardiac risk factors and is unable to exercise due to chronic pain and disability with her knees. She had a stress test however it's been over 3 years ago. Given her new onset of symptoms and shortness of breath with exertion, would like to repeat a LexiScan nuclear stress test. I plan to see her back to discuss results of that study. Somewhat encouraged by her recent echocardiogram however it does show moderate concentric LVH. Although diastolic function was comment a nonspecific normal, this is impossible and suggest that she does have stage B. heart failure according to the American Heart Association. This means that she needs to have continued aggressive medical therapy of her blood pressure.  Thanks again for referring  her.  Chrystie Nose, MD, [employer] Attending Cardiologist [facility]  Burnis Kaser C 01/19/2013, 11:46 AM

## 2013-01-19 NOTE — Patient Instructions (Signed)
Your physician has requested that you have a lexiscan myoview. For further information please visit [email or web address]. Please follow instruction sheet, as given.  Your physician recommends that you schedule a follow-up appointment after your stress test.   

## 2013-01-26 ENCOUNTER — Encounter (HOSPITAL_COMMUNITY): Payer: BC Managed Care – PPO

## 2013-02-01 ENCOUNTER — Ambulatory Visit (HOSPITAL_COMMUNITY)
Admission: RE | Admit: 2013-02-01 | Discharge: 2013-02-01 | Disposition: A | Discharge status: Home / Self Care | Payer: BC Managed Care – PPO | Source: Ambulatory Visit | Attending: Cardiovascular Disease | Admitting: Cardiovascular Disease

## 2013-02-01 DIAGNOSIS — Z789 Other specified health status: Secondary | ICD-10-CM

## 2013-02-01 DIAGNOSIS — R0989 Other specified symptoms and signs involving the circulatory and respiratory systems: Secondary | ICD-10-CM | POA: Insufficient documentation

## 2013-02-01 DIAGNOSIS — R002 Palpitations: Secondary | ICD-10-CM | POA: Insufficient documentation

## 2013-02-01 DIAGNOSIS — R0609 Other forms of dyspnea: Secondary | ICD-10-CM | POA: Insufficient documentation

## 2013-02-01 DIAGNOSIS — R42 Dizziness and giddiness: Secondary | ICD-10-CM | POA: Insufficient documentation

## 2013-02-01 DIAGNOSIS — E119 Type 2 diabetes mellitus without complications: Secondary | ICD-10-CM

## 2013-02-01 DIAGNOSIS — R079 Chest pain, unspecified: Secondary | ICD-10-CM

## 2013-02-01 MED ORDER — REGADENOSON 0.4 MG/5ML IV SOLN
0.4000 mg | Freq: Once | INTRAVENOUS | Status: AC
  Administered 2013-02-01: 0.4 mg via INTRAVENOUS

## 2013-02-01 MED ORDER — TECHNETIUM TC 99M SESTAMIBI GENERIC - CARDIOLITE
30.0000 | Freq: Once | INTRAVENOUS | Status: AC | PRN
  Administered 2013-02-01: 30 via INTRAVENOUS

## 2013-02-01 MED ORDER — TECHNETIUM TC 99M SESTAMIBI GENERIC - CARDIOLITE
10.0000 | Freq: Once | INTRAVENOUS | Status: AC | PRN
  Administered 2013-02-01: 10 via INTRAVENOUS

## 2013-02-01 NOTE — Procedures (Addendum)
[facility] [address] [place] [address] [place] [address] [phone]  Cardiology Nuclear Med Study  Anita Rodgers is a 42 y.o. female     MRN : [id number]     DOB: 08-19-1970  Procedure Date: 02/01/2013  Nuclear Med Background Indication for Stress Test:  Evaluation for Ischemia History:  No prior cardiac or respiratory history reported by patient. Cardiac Risk Factors: Family History - CAD, History of Smoking, Hypertension, NIDDM and Obesity  Symptoms:  Chest Pain, Dizziness, DOE, Light-Headedness and Palpitations   Nuclear Pre-Procedure Caffeine/Decaff Intake:  7:00pm NPO After: 5:00am   IV Site: R Hand  IV 0.9% NS with Angio Cath:  22g  Chest Size (in):  n/a IV Started by: Emmit Pomfret, RN  Height: 5\' 6"  (1.676 m)  Cup Size: DD  BMI:  Body mass index is 47.96 kg/(m^2). Weight:  297 lb (134.718 kg)   Tech Comments:  n/a    Nuclear Med Study 1 or 2 day study: 1 day  Stress Test Type:  Lexiscan  Order Authorizing Provider:  Zoila Shutter, MD   Resting Radionuclide: Technetium 71m Sestamibi  Resting Radionuclide Dose: 10.0 mCi   Stress Radionuclide:  Technetium 67m Sestamibi  Stress Radionuclide Dose: 29.1 mCi           Stress Protocol Rest HR: 77 Stress HR: 106  Rest BP: 126/67 Stress BP: 129/78  Exercise Time (min): n/a METS: n/a          Dose of Adenosine (mg):  n/a Dose of Lexiscan: 0.4 mg  Dose of Atropine (mg): n/a Dose of Dobutamine: n/a mcg/kg/min (at max HR)  Stress Test Technologist: Ernestene Mention, CCT Nuclear Technologist: Koren Shiver, CNMT   Rest Procedure:  Myocardial perfusion imaging was performed at rest 45 minutes following the intravenous administration of Technetium 65m Sestamibi. Stress Procedure:  The patient received IV Lexiscan 0.4 mg over 15-seconds.  Technetium 35m Sestamibi injected at 30-seconds.  There were no significant changes with Lexiscan.  Quantitative spect images were obtained after a  45 minute delay.  Transient Ischemic Dilatation (Normal <1.22):  0.60 Lung/Heart Ratio (Normal <0.45):  0.35 QGS EDV:  101 ml QGS ESV:  35 ml LV Ejection Fraction: 65%     Rest ECG: NSR with non-specific ST-T wave changes  Stress ECG: No significant change from baseline ECG  QPS Raw Data Images:  Image quality is mediocre due to body habitus, but study interpretation is not affected. Stress Images:  Normal homogeneous uptake in all areas of the myocardium. Rest Images:  Normal homogeneous uptake in all areas of the myocardium. Subtraction (SDS):  No evidence of ischemia. LV Wall Motion:  NL LV Function; NL Wall Motion  Impression Exercise Capacity:  Lexiscan with no exercise. BP Response:  Normal blood pressure response. Clinical Symptoms:  No significant symptoms noted. ECG Impression:  No significant ECG changes with Lexiscan. Comparison with Prior Nuclear Study: No previous nuclear study performed   Overall Impression:  Normal stress nuclear study.   Thurmon Fair, MD  02/01/2013 1:26 PM

## 2013-02-03 ENCOUNTER — Ambulatory Visit (INDEPENDENT_AMBULATORY_CARE_PROVIDER_SITE_OTHER): Payer: BC Managed Care – PPO | Admitting: Internal Medicine

## 2013-02-03 VITALS — BP 150/94 | HR 104 | Ht 66.0 in | Wt 309.1 lb

## 2013-02-03 DIAGNOSIS — I10 Essential (primary) hypertension: Secondary | ICD-10-CM

## 2013-02-03 DIAGNOSIS — R0609 Other forms of dyspnea: Secondary | ICD-10-CM

## 2013-02-03 DIAGNOSIS — G4733 Obstructive sleep apnea (adult) (pediatric): Secondary | ICD-10-CM

## 2013-02-03 DIAGNOSIS — E119 Type 2 diabetes mellitus without complications: Secondary | ICD-10-CM

## 2013-02-03 DIAGNOSIS — R600 Localized edema: Secondary | ICD-10-CM

## 2013-02-03 DIAGNOSIS — R609 Edema, unspecified: Secondary | ICD-10-CM

## 2013-02-03 DIAGNOSIS — R0600 Dyspnea, unspecified: Secondary | ICD-10-CM

## 2013-02-03 MED ORDER — AMLODIPINE BESYLATE-VALSARTAN 10-320 MG PO TABS: 1.0000 | ORAL_TABLET | Freq: Every day | ORAL | Status: DC

## 2013-02-03 NOTE — Patient Instructions (Addendum)
Your physician wants you to follow-up in: 6 months. You will receive a reminder letter in the mail two months in advance. If you don't receive a letter, please call our office to schedule the follow-up appointment.  [phone] (operator) Ask for weight-loss class.  These classes are monthly.   Dr. Rennis Golden has increased your dose of Exforge to 10-320 daily.

## 2013-02-04 ENCOUNTER — Encounter: Payer: Self-pay | Admitting: Internal Medicine

## 2013-02-04 NOTE — Progress Notes (Signed)
OFFICE NOTE  Chief Complaint:  Chest pain, dyspnea  Primary Care Physician: Caffie Damme, MD  HPI:  Anita Rodgers is a 42 year old female with a long-standing history of diabetes, recent 100 pound weight gain over the past 6 or 7 years, chronic knee pain with surgeries that cause her to be disabled from her job. Just is on chronic pain medication for this. Recently she's been having problems with lower extremities swelling as well as chest pain and shortness of breath with exertion. She described a recent episode where she felt chest tightness while walking up stairs. She does have difficulty doing this because of her knee problems. Affect is wearing a brace on her left knee today. Just reports she's been having swelling from time to time in the lower extremities in which they are massively swollen. She does say that the Lasix helped somewhat with this. With regards to her shortness of breath this has been worsening but may be associated with a 100 pound weight gain and the fact that she is not physically active. She does do some water aerobics and recently has tried to change her diet for weight loss. However during her water aerobics, there is little resistance that is performed and her heart rate is not necessarily getting elevated with exercise. She is referred for possible they have coronary disease. Of note she saw a cardiology physician assistant at [facility] several years ago and underwent an echocardiogram through [facility] in July of 2014. This showed moderate concentric LVH and apparently normal diastolic filling, however that is of course impossible.  Based on her symptoms I recommended nuclear stress testing. She underwent a Lexis scan nuclear stress test on 01/22/2013 which was negative for ischemia. Her EF was 65% which agrees with a recent echocardiogram in July of this year at [facility]. I do believe that most of her symptoms are related to her  near super morbid obesity with a BMI of 50.  PMHx:  Past Medical History  Diagnosis Date  . Diabetes mellitus   . Hypertension   . Sleep apnea     on CPAP  . Anxiety   . Depression   . Complication of anesthesia     pt states she has malignant hyperthermia  . GERD (gastroesophageal reflux disease)   . Hypothyroidism   . Headache(784.0)   . Arthritis     knees  . Seasonal allergies   . SVD (spontaneous vaginal delivery)     x 1  . Family history of anesthesia complication     malignant hyperthermia  . 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 Equinus deformity of foot     Bilateral    Past Surgical History  Procedure Laterality Date  . Knee surgery      x 2 left    FAMHx:  Family History  Problem Relation Age of Onset  . Hypertension Mother   . Hyperlipidemia Mother   . Depression Mother   . Hypertension Father   . Diabetes Father   . Hypertension Maternal Grandmother   . Diabetes Maternal Grandmother   . Stroke Maternal Grandmother   . Heart disease Maternal Grandmother   . Prostate cancer Maternal Grandfather   . Uterine cancer Paternal Grandmother   . Cervical cancer Paternal Grandmother   . Hypertension Sister     SOCHx:   reports that she quit smoking about 14 years ago. Her smoking use included Cigarettes. She has  a 1.5 pack-year smoking history. She has never used smokeless tobacco. She reports that she does not drink alcohol or use illicit drugs.  ALLERGIES:  Not on File  ROS: A comprehensive review of systems was negative except for: Constitutional: positive for fatigue Respiratory: positive for dyspnea on exertion Cardiovascular: positive for exertional chest pressure/discomfort and lower extremity edema Musculoskeletal: positive for bilateral knee pain - chronic  HOME MEDS: Current Outpatient Prescriptions  Medication Sig Dispense Refill  . acetaminophen (TYLENOL) 500 MG tablet Take 1,000 mg by mouth 2 (two) times daily as  needed. For pain        . ALPRAZolam (XANAX) 0.5 MG tablet Take 0.5 mg by mouth every 12 (twelve) hours as needed. For anxiety       . amitriptyline (ELAVIL) 25 MG tablet Take 25 mg by mouth at bedtime.       Marland Kitchen doxycycline (VIBRA-TABS) 100 MG tablet Take 100 mg by mouth daily.        . Escitalopram Oxalate (LEXAPRO PO) Take 10 mg by mouth daily.       . fluticasone (FLONASE) 50 MCG/ACT nasal spray Place 1 spray into the nose daily.        . furosemide (LASIX) 40 MG tablet Take 40 mg by mouth 2 (two) times daily.      Marland Kitchen HYDROmorphone (DILAUDID) 4 MG tablet Take 4 mg by mouth every 6 (six) hours as needed for pain.      Marland Kitchen HYDROmorphone HCl (EXALGO) 8 MG T24A SR tablet Take 24 mg by mouth daily.      Marland Kitchen levothyroxine (SYNTHROID, LEVOTHROID) 50 MCG tablet Take 50 mcg by mouth daily.      Marland Kitchen loratadine (CLARITIN) 10 MG tablet Take 10 mg by mouth daily.        . metFORMIN (GLUCOPHAGE-XR) 500 MG 24 hr tablet Take 1,000 mg by mouth 2 (two) times daily.       . montelukast (SINGULAIR) 10 MG tablet Take 10 mg by mouth at bedtime.        . Multiple Vitamin (MULITIVITAMIN WITH MINERALS) TABS Take 1 tablet by mouth daily.      . Nebivolol HCl (BYSTOLIC) 20 MG TABS Take 1 tablet by mouth at bedtime.       . NON FORMULARY Take 4 tablets by mouth daily. Omega XL      . NON FORMULARY at bedtime. CPAP      . potassium chloride SA (K-DUR,KLOR-CON) 20 MEQ tablet Take 20 mEq by mouth daily.      . progesterone (PROMETRIUM) 200 MG capsule Take 200 mg by mouth at bedtime.       . SUMAtriptan (IMITREX) 50 MG tablet Take 50 mg by mouth once as needed. For migraine       . amLODipine-valsartan (EXFORGE) 10-320 MG per tablet Take 1 tablet by mouth daily.  90 tablet  3  . Polyethyl Glycol-Propyl Glycol (SYSTANE ULTRA) 0.4-0.3 % SOLN Place 1 drop into both eyes daily as needed. For dry eyes       No current facility-administered medications for this visit.    LABS/IMAGING: No results found for this or any previous  visit (from the past 48 hour(s)). No results found.  VITALS: BP 150/94  Pulse 104  Ht 5\' 6"  (1.676 m)  Wt 309 lb 1.6 oz (140.207 kg)  BMI 49.91 kg/m2  EXAM: General appearance: alert, no distress and significant facial acne Neck: no carotid bruit and no JVD Lungs: clear to auscultation bilaterally  Heart: regular rate and rhythm, S1, S2 normal, no murmur, click, rub or gallop Abdomen: soft, non-tender; bowel sounds normal; no masses,  no organomegaly and morbidly obese, no fluid wave Extremities: no edema, no varicose veins, knee brace on the left knee Pulses: 2+ and symmetric Skin: Skin color, texture, turgor normal. No rashes or lesions Neurologic: Mental status: Alert, oriented, thought content appropriate Psych: Mood, affect normal, appropriate  EKG: Normal sinus rhythm at 99, nonspecific T wave changes  ASSESSMENT: 1. Chest pain and worsening shortness of breath with exertion- negative myoview 01/2013 2. Morbid obesity 3. Obstructive sleep apnea and CPAP 4. Type 2 diabetes - recent A1c 7.4 5. Dyslipidemia 6. Hypertension  PLAN: 1.   Mrs. Anita Rodgers had a negative nuclear stress testing continues to have shortness of breath with exertion. I really do believe this is related to her near super morbid obesity.  I would recommend better blood pressure control and have increased her amlodipine/valsartan and 10/320 mg daily. We have referred her to the [facility] for comprehensive weight management as well as a possible evaluation for bariatric surgery. Given her significant knee problems and her inability to exercise, do not believe she'll ever able to lose the weight without surgery. However, if she could have significant weight loss, she would be a much better candidate for knee replacement surgery which currently is being planned to be put off for several years if possible.  Thanks again for referring her.  Chrystie Nose, MD, [facility] Attending Cardiologist [facility]  Maribell Demeo C 02/04/2013, 11:14 AM

## 2013-10-26 ENCOUNTER — Encounter: Payer: Self-pay | Admitting: Podiatry

## 2013-10-26 ENCOUNTER — Ambulatory Visit (INDEPENDENT_AMBULATORY_CARE_PROVIDER_SITE_OTHER): Payer: BC Managed Care – PPO | Admitting: Podiatry

## 2013-10-26 ENCOUNTER — Ambulatory Visit: Payer: Self-pay | Admitting: Podiatry

## 2013-10-26 VITALS — BP 155/83 | HR 87 | Ht 66.0 in | Wt 300.0 lb

## 2013-10-26 DIAGNOSIS — M216X2 Other acquired deformities of left foot: Secondary | ICD-10-CM

## 2013-10-26 DIAGNOSIS — M7742 Metatarsalgia, left foot: Secondary | ICD-10-CM | POA: Insufficient documentation

## 2013-10-26 DIAGNOSIS — R609 Edema, unspecified: Secondary | ICD-10-CM

## 2013-10-26 DIAGNOSIS — M216X9 Other acquired deformities of unspecified foot: Secondary | ICD-10-CM | POA: Insufficient documentation

## 2013-10-26 DIAGNOSIS — M79609 Pain in unspecified limb: Secondary | ICD-10-CM

## 2013-10-26 DIAGNOSIS — R600 Localized edema: Secondary | ICD-10-CM

## 2013-10-26 DIAGNOSIS — M7750 Other enthesopathy of unspecified foot: Secondary | ICD-10-CM

## 2013-10-26 NOTE — Progress Notes (Signed)
Subjective: Pain on left plantar lateral forefoot duration of 3 weeks. Not on foot much. Wears left knee brace. Hurt left knee at work and had done two knee surgeries. Injury occurred in 2007.  She was seen here a year ago and Custom orthotics were dispensed. Patient still wears orthotics.   Objective: Positive of bilateral foot and ankle edema. Loss of sensory perception on left lower limb. Positive of forefoot varus bilateral. Excessive STJ pronation L>R. Radiographic examination reveal normal osseous and articular structures. No acute changes at the affected area of left lateral column.   Assessment: Metatarsalgia lateral column left foot. STJ hyperpronation with forefoot varus.  Plan: Continue with custom orthotics, exercise, and walking. Ankle brace dispensed for the left foot.

## 2013-10-26 NOTE — Patient Instructions (Signed)
Seen for pain in left foot. Ankle brace placed. Also need compression socks. Continue with exercise. Return as needed.

## 2014-02-21 ENCOUNTER — Other Ambulatory Visit: Payer: Self-pay | Admitting: Internal Medicine

## 2014-02-21 NOTE — Telephone Encounter (Signed)
Rx was sent to pharmacy electronically. 

## 2014-05-18 ENCOUNTER — Other Ambulatory Visit: Payer: Self-pay | Admitting: Internal Medicine

## 2014-05-19 NOTE — Telephone Encounter (Signed)
Rx(s) sent to pharmacy electronically.  

## 2014-06-19 ENCOUNTER — Other Ambulatory Visit: Payer: Self-pay | Admitting: Internal Medicine

## 2014-06-19 NOTE — Telephone Encounter (Signed)
Rx has been sent to the pharmacy electronically. ° °

## 2014-06-21 ENCOUNTER — Telehealth: Payer: Self-pay | Admitting: *Deleted

## 2014-06-21 MED ORDER — AMLODIPINE BESYLATE-VALSARTAN 10-320 MG PO TABS: 1.0000 | ORAL_TABLET | Freq: Every day | ORAL | Status: DC

## 2014-06-21 NOTE — Telephone Encounter (Signed)
Received notification that prior auth for exforge needed to be completed. Called patient's insurance PA line and was informed that this medication was covered for #30 for 30 days. Called pharmacy to provide this updated info and authorized to changed Rx to #30 with 0 refills

## 2014-06-23 ENCOUNTER — Ambulatory Visit: Payer: Self-pay | Admitting: Podiatry

## 2014-07-25 ENCOUNTER — Other Ambulatory Visit: Payer: Self-pay | Admitting: Internal Medicine

## 2014-08-08 ENCOUNTER — Encounter (HOSPITAL_BASED_OUTPATIENT_CLINIC_OR_DEPARTMENT_OTHER): Payer: Self-pay | Admitting: *Deleted

## 2014-08-08 ENCOUNTER — Emergency Department (HOSPITAL_BASED_OUTPATIENT_CLINIC_OR_DEPARTMENT_OTHER)
Admission: EM | Admit: 2014-08-08 | Discharge: 2014-08-09 | Disposition: A | Discharge status: Home / Self Care | Payer: BLUE CROSS/BLUE SHIELD | Attending: Emergency Medicine | Admitting: Emergency Medicine

## 2014-08-08 DIAGNOSIS — Z8709 Personal history of other diseases of the respiratory system: Secondary | ICD-10-CM | POA: Insufficient documentation

## 2014-08-08 DIAGNOSIS — Z87891 Personal history of nicotine dependence: Secondary | ICD-10-CM | POA: Insufficient documentation

## 2014-08-08 DIAGNOSIS — Z7951 Long term (current) use of inhaled steroids: Secondary | ICD-10-CM | POA: Diagnosis not present

## 2014-08-08 DIAGNOSIS — G4730 Sleep apnea, unspecified: Secondary | ICD-10-CM | POA: Diagnosis not present

## 2014-08-08 DIAGNOSIS — Z79899 Other long term (current) drug therapy: Secondary | ICD-10-CM | POA: Diagnosis not present

## 2014-08-08 DIAGNOSIS — Z8719 Personal history of other diseases of the digestive system: Secondary | ICD-10-CM | POA: Diagnosis not present

## 2014-08-08 DIAGNOSIS — Y939 Activity, unspecified: Secondary | ICD-10-CM | POA: Diagnosis not present

## 2014-08-08 DIAGNOSIS — E039 Hypothyroidism, unspecified: Secondary | ICD-10-CM | POA: Diagnosis not present

## 2014-08-08 DIAGNOSIS — Y999 Unspecified external cause status: Secondary | ICD-10-CM | POA: Diagnosis not present

## 2014-08-08 DIAGNOSIS — M79671 Pain in right foot: Secondary | ICD-10-CM | POA: Diagnosis present

## 2014-08-08 DIAGNOSIS — X58XXXA Exposure to other specified factors, initial encounter: Secondary | ICD-10-CM | POA: Insufficient documentation

## 2014-08-08 DIAGNOSIS — F419 Anxiety disorder, unspecified: Secondary | ICD-10-CM | POA: Diagnosis not present

## 2014-08-08 DIAGNOSIS — Z792 Long term (current) use of antibiotics: Secondary | ICD-10-CM | POA: Insufficient documentation

## 2014-08-08 DIAGNOSIS — F329 Major depressive disorder, single episode, unspecified: Secondary | ICD-10-CM | POA: Insufficient documentation

## 2014-08-08 DIAGNOSIS — E119 Type 2 diabetes mellitus without complications: Secondary | ICD-10-CM | POA: Insufficient documentation

## 2014-08-08 DIAGNOSIS — Y929 Unspecified place or not applicable: Secondary | ICD-10-CM | POA: Insufficient documentation

## 2014-08-08 DIAGNOSIS — M1990 Unspecified osteoarthritis, unspecified site: Secondary | ICD-10-CM | POA: Diagnosis not present

## 2014-08-08 DIAGNOSIS — I10 Essential (primary) hypertension: Secondary | ICD-10-CM | POA: Insufficient documentation

## 2014-08-08 DIAGNOSIS — S96911A Strain of unspecified muscle and tendon at ankle and foot level, right foot, initial encounter: Secondary | ICD-10-CM | POA: Diagnosis not present

## 2014-08-08 DIAGNOSIS — X503XXA Overexertion from repetitive movements, initial encounter: Secondary | ICD-10-CM

## 2014-08-08 NOTE — ED Notes (Signed)
Pt c/o right foot pain x 1 week 

## 2014-08-09 ENCOUNTER — Emergency Department (HOSPITAL_BASED_OUTPATIENT_CLINIC_OR_DEPARTMENT_OTHER): Payer: BLUE CROSS/BLUE SHIELD

## 2014-08-09 DIAGNOSIS — S96911A Strain of unspecified muscle and tendon at ankle and foot level, right foot, initial encounter: Secondary | ICD-10-CM | POA: Diagnosis not present

## 2014-08-09 NOTE — ED Provider Notes (Signed)
CSN: [id number]     Arrival date & time 08/08/14  2339 History   First MD Initiated Contact with Patient 08/09/14 0113     Chief Complaint  Patient presents with  . Foot Pain     (Consider location/radiation/quality/duration/timing/severity/associated sxs/prior Treatment) HPI  This is a 44 year old female with history of left knee surgery and chronic left knee pain that causes her to bear her weight primarily on her right foot. She is here with a one-week history of pain in her right foot. The pain is located dorsally and laterally. It involves the third fourth and fifth toes. The pain is worse with weightbearing or movement. She denies acute trauma. She states the pain is so severe she is having difficulty ambulating. She has been bearing weight on the medial aspect of the foot to ease the discomfort. There is no associated deformity.  Past Medical History  Diagnosis Date  . Diabetes mellitus   . Hypertension   . Sleep apnea     on CPAP  . Anxiety   . Depression   . Complication of anesthesia     pt states she has malignant hyperthermia  . GERD (gastroesophageal reflux disease)   . Hypothyroidism   . Headache(784.0)   . Arthritis     knees  . Seasonal allergies   . SVD (spontaneous vaginal delivery)     x 1  . Family history of anesthesia complication     malignant hyperthermia  . 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 Equinus deformity of foot     Bilateral   Past Surgical History  Procedure Laterality Date  . Knee surgery      x 2 left   Family History  Problem Relation Age of Onset  . Hypertension Mother   . Hyperlipidemia Mother   . Depression Mother   . Hypertension Father   . Diabetes Father   . Hypertension Maternal Grandmother   . Diabetes Maternal Grandmother   . Stroke Maternal Grandmother   . Heart disease Maternal Grandmother   . Prostate cancer Maternal Grandfather   . Uterine cancer Paternal Grandmother   . Cervical cancer Paternal  Grandmother   . Hypertension Sister    History  Substance Use Topics  . Smoking status: Former Smoker -- 0.50 packs/day for 3 years    Types: Cigarettes    Quit date: 03/24/1998  . Smokeless tobacco: Never Used  . Alcohol Use: No   OB History    No data available     Review of Systems  All other systems reviewed and are negative.   Allergies  Review of patient's allergies indicates no known allergies.  Home Medications   Prior to Admission medications   Medication Sig Start Date End Date Taking? Authorizing Provider  acetaminophen (TYLENOL) 500 MG tablet Take 1,000 mg by mouth 2 (two) times daily as needed. For pain      Historical Provider, MD  ALPRAZolam Duanne Moron) 0.5 MG tablet Take 0.5 mg by mouth every 12 (twelve) hours as needed. For anxiety     Historical Provider, MD  amitriptyline (ELAVIL) 25 MG tablet Take 25 mg by mouth at bedtime.     Historical Provider, MD  doxycycline (VIBRA-TABS) 100 MG tablet Take 100 mg by mouth daily.      Historical Provider, MD  Escitalopram Oxalate (LEXAPRO PO) Take 10 mg by mouth daily.     Historical Provider, MD  EXFORGE 10-320 MG per tablet TAKE 1 TABLET  BY MOUTH DAILY 07/25/14   Pixie Casino, MD  fluticasone [facility]) 50 MCG/ACT nasal spray Place 1 spray into the nose daily.      Historical Provider, MD  furosemide (LASIX) 40 MG tablet Take 40 mg by mouth 2 (two) times daily.    Historical Provider, MD  HYDROmorphone (DILAUDID) 4 MG tablet Take 4 mg by mouth every 6 (six) hours as needed for pain.    Historical Provider, MD  HYDROmorphone HCl (EXALGO) 8 MG T24A SR tablet Take 24 mg by mouth daily.    Historical Provider, MD  levothyroxine (SYNTHROID, LEVOTHROID) 50 MCG tablet Take 50 mcg by mouth daily.    Historical Provider, MD  loratadine (CLARITIN) 10 MG tablet Take 10 mg by mouth daily.      Historical Provider, MD  metFORMIN (GLUCOPHAGE-XR) 500 MG 24 hr tablet Take 1,000 mg by mouth 2 (two) times daily.     Historical Provider, MD   montelukast (SINGULAIR) 10 MG tablet Take 10 mg by mouth at bedtime.      Historical Provider, MD  Multiple Vitamin (MULITIVITAMIN WITH MINERALS) TABS Take 1 tablet by mouth daily.    Historical Provider, MD  Nebivolol HCl (BYSTOLIC) 20 MG TABS Take 1 tablet by mouth at bedtime.     Historical Provider, MD  NON FORMULARY Take 4 tablets by mouth daily. Omega XL    Historical Provider, MD  NON FORMULARY at bedtime. CPAP    Historical Provider, MD  Polyethyl Glycol-Propyl Glycol (SYSTANE ULTRA) 0.4-0.3 % SOLN Place 1 drop into both eyes daily as needed. For dry eyes    Historical Provider, MD  potassium chloride SA (K-DUR,KLOR-CON) 20 MEQ tablet Take 20 mEq by mouth daily.    Historical Provider, MD  progesterone (PROMETRIUM) 200 MG capsule Take 200 mg by mouth at bedtime.     Historical Provider, MD  SUMAtriptan (IMITREX) 50 MG tablet Take 50 mg by mouth once as needed. For migraine     Historical Provider, MD   BP 133/78 mmHg  Pulse 96  Temp(Src) 99.5 F (37.5 C) (Oral)  Resp 22  Ht 5\' 6"  (1.676 m)  Wt 300 lb (136.079 kg)  BMI 48.44 kg/m2  SpO2 98%   Physical Exam  General: Well-developed, well-nourished female in no acute distress; appearance consistent with age of record HENT: normocephalic; atraumatic Eyes: pupils equal, round and reactive to light; extraocular muscles intact Neck: supple Heart: regular rate and rhythm Lungs: clear to auscultation bilaterally Abdomen: soft; nondistended; nontender; bowel sounds present Extremities: No deformity; full range of motion; pulses normal; trace edema of lower legs; tenderness over dorsal lateral distal right foot without erythema, warmth, swelling or deformity Neurologic: Awake, alert and oriented; motor function intact in all extremities and symmetric; no facial droop Skin: Warm and dry Psychiatric: Normal mood and affect    ED Course  Procedures (including critical care time)   MDM  Nursing notes and vitals signs, including  pulse oximetry, reviewed.  Summary of this visit's results, reviewed by myself:  Imaging Studies: Dg Foot Complete Right  2014/08/11   CLINICAL DATA:  Acute onset of right foot pain for 1 week. Initial encounter.  EXAM: RIGHT FOOT COMPLETE - 3+ VIEW  COMPARISON:  Right foot radiographs performed 10/03/2012  FINDINGS: There is no evidence of fracture or dislocation. The joint spaces are preserved. There is no evidence of talar subluxation; the subtalar joint is unremarkable in appearance. An os peroneum is noted. A posterior calcaneal spur is seen.  Mild diffuse soft tissue swelling is noted about the forefoot.  IMPRESSION: 1. No evidence of fracture or dislocation. 2. Os peroneum noted.   Electronically Signed   By: Garald Balding M.D.   On: 08/09/2014 01:54   The patient is already on high-dose hydromorphone extended release and immediate release. Additional analgesia is thought indicated. We will try her in a postop shoe and she will follow-up with her orthopedist.     Shanon Rosser, MD 08/09/14 [phone]

## 2014-08-09 NOTE — ED Notes (Signed)
Pt returned from xray

## 2014-08-11 ENCOUNTER — Ambulatory Visit: Payer: Self-pay | Admitting: Podiatry

## 2014-09-27 ENCOUNTER — Other Ambulatory Visit: Payer: Self-pay | Admitting: Internal Medicine

## 2014-09-27 NOTE — Telephone Encounter (Signed)
Rx(s) sent to pharmacy electronically. Last OV 01/2013

## 2015-07-12 ENCOUNTER — Other Ambulatory Visit: Payer: Self-pay | Admitting: Obstetrics and Gynecology

## 2015-07-12 DIAGNOSIS — D259 Leiomyoma of uterus, unspecified: Secondary | ICD-10-CM

## 2015-07-18 ENCOUNTER — Other Ambulatory Visit: Payer: Medicare Other

## 2015-07-24 ENCOUNTER — Other Ambulatory Visit: Payer: Self-pay | Admitting: Obstetrics and Gynecology

## 2015-07-24 ENCOUNTER — Ambulatory Visit
Admission: RE | Admit: 2015-07-24 | Discharge: 2015-07-24 | Disposition: A | Discharge status: Home / Self Care | Payer: Medicare Other | Source: Ambulatory Visit | Attending: Obstetrics and Gynecology | Admitting: Obstetrics and Gynecology

## 2015-07-24 ENCOUNTER — Other Ambulatory Visit: Payer: Self-pay | Admitting: Radiology

## 2015-07-24 DIAGNOSIS — D259 Leiomyoma of uterus, unspecified: Secondary | ICD-10-CM

## 2015-07-24 HISTORY — DX: Benign neoplasm of connective and other soft tissue, unspecified: D21.9

## 2015-07-24 HISTORY — PX: IR GENERIC HISTORICAL: IMG1180011

## 2015-07-24 LAB — CREATININE WITH EST GFR: Creat: 0.72 mg/dL (ref 0.50–1.10)

## 2015-07-24 LAB — BUN: BUN: 8 mg/dL (ref 7–25)

## 2015-07-24 NOTE — Consult Note (Signed)
Chief Complaint: Abnormal menstrual bleeding.  Referring Physician(s): Cousins,Sheronette  Patient Status: Out-pt  History of Present Illness: Anita Rodgers is a 45 y.o. female with systematic uterine fibroids. She is a G1 P1. No definite future pregnancy plans. Review of her menstrual history performed. Last menstrual period 07/02/2015. She has a very irregular cycle lasting up to 15 days with several heavy days. She describes passage of blood clots. She has to wear depends undergarments during her period with frequency of change every 2 hours. No interperiod bleeding. Fibroids were originally diagnosed in 2016. She has an associated iron deficiency anemia and has had a previous iron transfusion. She has bulk related symptoms including bloating, urinary frequency and urgency. She also has lower abdominal and pelvic pressure and pain. No previous fibroid surgeries. No current birth-control pills or hormone replacement therapies. She had a recent mild yeast infection because of antibiotic therapy for acne.  Additional tests: Pap smear 10/04/2014 negative. Endometrial biopsy 07/02/2015 showed no hyperplasia or malignancy. Outpatient ultrasound at the office 10/24/2014 demonstrated multiple uterine fibroids, largest measuring 4.3 cm.  She presents today to discuss uterine fibroid embolization therapy.  Past Medical History  Diagnosis Date  . Diabetes mellitus   . Hypertension   . Sleep apnea     on CPAP  . Anxiety   . Depression   . Complication of anesthesia     pt states she has malignant hyperthermia  . GERD (gastroesophageal reflux disease)   . Hypothyroidism   . Headache(784.0)   . Arthritis     knees  . Seasonal allergies   . SVD (spontaneous vaginal delivery)     x 1  . Family history of anesthesia complication     malignant hyperthermia  . 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 Equinus deformity of foot     Bilateral  . Fibroids     Past Surgical  History  Procedure Laterality Date  . Knee surgery      x 2 left    Allergies: Penicillins  Medications: Prior to Admission medications   Medication Sig Start Date End Date Taking? Authorizing Provider  acetaminophen (TYLENOL) 500 MG tablet Take 1,000 mg by mouth 2 (two) times daily as needed. For pain     Yes Historical Provider, MD  ALPRAZolam (XANAX) 0.5 MG tablet Take 0.5 mg by mouth every 12 (twelve) hours as needed. For anxiety    Yes Historical Provider, MD  amitriptyline (ELAVIL) 25 MG tablet Take 25 mg by mouth at bedtime.    Yes Historical Provider, MD  doxycycline (VIBRA-TABS) 100 MG tablet Take 100 mg by mouth daily. Reported on 07/24/2015   Yes Historical Provider, MD  Escitalopram Oxalate (LEXAPRO PO) Take 10 mg by mouth daily.    Yes Historical Provider, MD  EXFORGE 10-320 MG per tablet TAKE 1 TABLET BY MOUTH EVERY DAY**NEEDS APPOINTMENT FOR FURTHER REFILLS** 09/27/14  Yes Pixie Casino, MD  fluticasone (FLONASE) 50 MCG/ACT nasal spray Place 1 spray into the nose daily.     Yes Historical Provider, MD  furosemide (LASIX) 40 MG tablet Take 40 mg by mouth 2 (two) times daily.   Yes Historical Provider, MD  HYDROmorphone HCl (EXALGO) 8 MG T24A SR tablet Take 24 mg by mouth daily.   Yes Historical Provider, MD  levothyroxine (SYNTHROID, LEVOTHROID) 50 MCG tablet Take 50 mcg by mouth daily.   Yes Historical Provider, MD  Magnesium 250 MG TABS Take 1 tablet by mouth  daily.   Yes Historical Provider, MD  metFORMIN (GLUCOPHAGE-XR) 500 MG 24 hr tablet Take 1,000 mg by mouth 2 (two) times daily.    Yes Historical Provider, MD  montelukast (SINGULAIR) 10 MG tablet Take 10 mg by mouth at bedtime.     Yes Historical Provider, MD  Multiple Vitamin (MULITIVITAMIN WITH MINERALS) TABS Take 1 tablet by mouth daily.   Yes Historical Provider, MD  NON FORMULARY Take 4 tablets by mouth daily. Omega XL   Yes Historical Provider, MD  NON FORMULARY at bedtime. CPAP   Yes Historical Provider, MD    Polyethyl Glycol-Propyl Glycol (SYSTANE ULTRA) 0.4-0.3 % SOLN Place 1 drop into both eyes daily as needed. For dry eyes   Yes Historical Provider, MD  potassium chloride SA (K-DUR,KLOR-CON) 20 MEQ tablet Take 20 mEq by mouth daily.   Yes Historical Provider, MD  progesterone (PROMETRIUM) 200 MG capsule Take 200 mg by mouth at bedtime.    Yes Historical Provider, MD  spironolactone (ALDACTONE) 50 MG tablet Take 50 mg by mouth daily.   Yes Historical Provider, MD  HYDROmorphone (DILAUDID) 4 MG tablet Take 4 mg by mouth every 6 (six) hours as needed for pain.    Historical Provider, MD  loratadine (CLARITIN) 10 MG tablet Take 10 mg by mouth daily.      Historical Provider, MD  Nebivolol HCl (BYSTOLIC) 20 MG TABS Take 1 tablet by mouth at bedtime.     Historical Provider, MD  SUMAtriptan (IMITREX) 50 MG tablet Take 50 mg by mouth once as needed. For migraine     Historical Provider, MD     Family History  Problem Relation Age of Onset  . Hypertension Mother   . Hyperlipidemia Mother   . Depression Mother   . Hypertension Father   . Diabetes Father   . Hypertension Maternal Grandmother   . Diabetes Maternal Grandmother   . Stroke Maternal Grandmother   . Heart disease Maternal Grandmother   . Prostate cancer Maternal Grandfather   . Uterine cancer Paternal Grandmother   . Cervical cancer Paternal Grandmother   . Hypertension Sister     Social History   Social History  . Marital Status: Single    Spouse Name: N/A  . Number of Children: N/A  . Years of Education: N/A   Social History Main Topics  . Smoking status: Former Smoker -- 0.50 packs/day for 3 years    Types: Cigarettes    Quit date: 03/24/1998  . Smokeless tobacco: Never Used  . Alcohol Use: No  . Drug Use: No  . Sexual Activity: Yes    Birth Control/ Protection: Condom   Other Topics Concern  . Not on file   Social History Narrative    Review of Systems: A 12 point ROS discussed and pertinent positives are  indicated in the HPI above.  All other systems are negative.  Review of Systems  Constitutional: Positive for fatigue. Negative for fever, activity change and unexpected weight change.  Respiratory: Negative for chest tightness and shortness of breath.   Cardiovascular: Negative for chest pain.  Gastrointestinal: Negative for abdominal pain and abdominal distention.  Genitourinary: Positive for urgency, frequency, vaginal bleeding and pelvic pain. Negative for flank pain.    Vital Signs: BP 118/59 mmHg  Pulse 92  Temp(Src) 98.2 F (36.8 C) (Oral)  Resp 14  Ht 5\' 6"  (1.676 m)  Wt 307 lb (139.254 kg)  BMI 49.57 kg/m2  SpO2 99%  LMP 07/02/2015 (Exact Date)  Physical Exam  Constitutional:  Moderately obese female in no distress.  Cardiovascular: Normal rate and regular rhythm.  Exam reveals no friction rub.   No murmur heard. Pulmonary/Chest: Effort normal and breath sounds normal. No respiratory distress. She has no wheezes.  Abdominal: Soft. Bowel sounds are normal. She exhibits no distension and no mass. There is no tenderness. No hernia.  No organomegaly. No enlarged uterus is palpable.  Musculoskeletal:  Symmetric +2 femoral and pedal pulses.  Skin: No rash noted. No erythema. No pallor.  Psychiatric: She has a normal mood and affect. Her behavior is normal.    Mallampati Score:   1  Imaging:  Report from the office ultrasound demonstrates a uterus measuring 9.4 x 5.8 x 5.7 cm. Multiple uterine fibroids demonstrated, largest measuring 4.3 cm.  Assessment and Plan:  45 year old female with symptomatic uterine fibroids and abnormal menstrual bleeding with passage of blood clots and bulk related symptoms including urinary frequency, urgency, pelvic pain and pressure. We had a lengthy discussion about treatment options including uterine fibroid embolization. The procedure, risks, benefits and alternatives were reviewed in detail. All questions were addressed. She has a  clear understanding of the procedure. After our discussion she would like to proceed with the workup which would include a pelvic MRI without and with contrast to make sure her fibroid anatomy is amenable to embolization therapy.  Plan: Schedule for pelvic MRI without and with contrast to define fibroid anatomy and to confirm she is a candidate for embolization therapy. She can then be scheduled electively at [facility] in the next few weeks.  Thank you for this interesting consult.  I greatly enjoyed meeting Anita Rodgers and look forward to participating in their care.  A copy of this report was sent to the requesting provider on this date.  Electronically Signed: Greggory Keen 07/24/2015, 11:27 AM   I spent a total of  40 Minutes   in face to face in clinical consultation, greater than 50% of which was counseling/coordinating care for This patient with systematic uterine fibroids.

## 2015-07-31 ENCOUNTER — Inpatient Hospital Stay: Admission: RE | Admit: 2015-07-31 | Payer: Medicare Other | Source: Ambulatory Visit

## 2015-08-14 ENCOUNTER — Ambulatory Visit
Admission: RE | Admit: 2015-08-14 | Discharge: 2015-08-14 | Disposition: A | Discharge status: Home / Self Care | Payer: Medicare Other | Source: Ambulatory Visit | Attending: Obstetrics and Gynecology | Admitting: Obstetrics and Gynecology

## 2015-08-14 DIAGNOSIS — D259 Leiomyoma of uterus, unspecified: Secondary | ICD-10-CM

## 2015-08-30 ENCOUNTER — Ambulatory Visit
Admission: RE | Admit: 2015-08-30 | Discharge: 2015-08-30 | Disposition: A | Discharge status: Home / Self Care | Payer: Medicare Other | Source: Ambulatory Visit | Attending: Obstetrics and Gynecology | Admitting: Obstetrics and Gynecology

## 2015-08-30 MED ORDER — GADOBENATE DIMEGLUMINE 529 MG/ML IV SOLN
20.0000 mL | Freq: Once | INTRAVENOUS | Status: AC | PRN
  Administered 2015-08-30: 20 mL via INTRAVENOUS

## 2015-09-03 ENCOUNTER — Other Ambulatory Visit (HOSPITAL_COMMUNITY): Payer: Self-pay | Admitting: Interventional Radiology

## 2015-09-03 DIAGNOSIS — D259 Leiomyoma of uterus, unspecified: Secondary | ICD-10-CM

## 2015-10-02 ENCOUNTER — Other Ambulatory Visit: Payer: Self-pay | Admitting: Radiology

## 2015-10-02 ENCOUNTER — Other Ambulatory Visit: Payer: Self-pay | Admitting: General Surgery

## 2015-10-03 ENCOUNTER — Encounter (HOSPITAL_COMMUNITY): Payer: Self-pay

## 2015-10-03 ENCOUNTER — Ambulatory Visit (HOSPITAL_COMMUNITY)
Admission: RE | Admit: 2015-10-03 | Discharge: 2015-10-03 | Disposition: A | Discharge status: Home / Self Care | Payer: Medicare Other | Source: Ambulatory Visit | Attending: Interventional Radiology | Admitting: Interventional Radiology

## 2015-10-03 ENCOUNTER — Observation Stay (HOSPITAL_COMMUNITY)
Admission: RE | Admit: 2015-10-03 | Discharge: 2015-10-04 | Disposition: A | Discharge status: Home / Self Care | Payer: Medicare Other | Source: Ambulatory Visit | Attending: Interventional Radiology | Admitting: Interventional Radiology

## 2015-10-03 DIAGNOSIS — M1990 Unspecified osteoarthritis, unspecified site: Secondary | ICD-10-CM | POA: Diagnosis not present

## 2015-10-03 DIAGNOSIS — Z5181 Encounter for therapeutic drug level monitoring: Secondary | ICD-10-CM | POA: Insufficient documentation

## 2015-10-03 DIAGNOSIS — Z7984 Long term (current) use of oral hypoglycemic drugs: Secondary | ICD-10-CM | POA: Insufficient documentation

## 2015-10-03 DIAGNOSIS — R102 Pelvic and perineal pain: Secondary | ICD-10-CM | POA: Insufficient documentation

## 2015-10-03 DIAGNOSIS — F329 Major depressive disorder, single episode, unspecified: Secondary | ICD-10-CM | POA: Diagnosis not present

## 2015-10-03 DIAGNOSIS — Z79899 Other long term (current) drug therapy: Secondary | ICD-10-CM | POA: Diagnosis not present

## 2015-10-03 DIAGNOSIS — D259 Leiomyoma of uterus, unspecified: Secondary | ICD-10-CM | POA: Diagnosis not present

## 2015-10-03 DIAGNOSIS — E119 Type 2 diabetes mellitus without complications: Secondary | ICD-10-CM | POA: Insufficient documentation

## 2015-10-03 DIAGNOSIS — I10 Essential (primary) hypertension: Secondary | ICD-10-CM | POA: Diagnosis not present

## 2015-10-03 DIAGNOSIS — Z87891 Personal history of nicotine dependence: Secondary | ICD-10-CM | POA: Insufficient documentation

## 2015-10-03 DIAGNOSIS — E039 Hypothyroidism, unspecified: Secondary | ICD-10-CM | POA: Insufficient documentation

## 2015-10-03 LAB — PROTIME-INR
INR: 1.06 (ref 0.00–1.49)
PROTHROMBIN TIME: 13.6 s (ref 11.6–15.2)

## 2015-10-03 LAB — BASIC METABOLIC PANEL
Anion gap: 7 (ref 5–15)
BUN: 9 mg/dL (ref 6–20)
CALCIUM: 8.9 mg/dL (ref 8.9–10.3)
CO2: 26 mmol/L (ref 22–32)
CREATININE: 0.76 mg/dL (ref 0.44–1.00)
Chloride: 107 mmol/L (ref 101–111)
GFR calc Af Amer: >60 mL/min (ref >60)
GLUCOSE: 153 mg/dL — AB (ref 65–99)
Potassium: 3.7 mmol/L (ref 3.5–5.1)
SODIUM: 140 mmol/L (ref 135–145)

## 2015-10-03 LAB — GLUCOSE, CAPILLARY
Glucose-Capillary: 166 mg/dL — ABNORMAL HIGH (ref 65–99)
Glucose-Capillary: 233 mg/dL — ABNORMAL HIGH (ref 65–99)
Glucose-Capillary: 177 mg/dL — ABNORMAL HIGH (ref 65–99)

## 2015-10-03 LAB — CBC WITH DIFFERENTIAL/PLATELET
BASOS PCT: 0 %
Basophils Absolute: 0 10*3/uL (ref 0.0–0.1)
Eosinophils Absolute: 0.2 10*3/uL (ref 0.0–0.7)
Eosinophils Relative: 3 %
HEMATOCRIT: 34.2 % — AB (ref 36.0–46.0)
HEMOGLOBIN: 10.8 g/dL — AB (ref 12.0–15.0)
LYMPHS ABS: 2.2 10*3/uL (ref 0.7–4.0)
Lymphocytes Relative: 36 %
MCH: 24.4 pg — AB (ref 26.0–34.0)
MCHC: 31.6 g/dL (ref 30.0–36.0)
MCV: 77.4 fL — ABNORMAL LOW (ref 78.0–100.0)
MONOS PCT: 9 %
Monocytes Absolute: 0.5 10*3/uL (ref 0.1–1.0)
NEUTROS ABS: 3.1 10*3/uL (ref 1.7–7.7)
NEUTROS PCT: 52 %
Platelets: 371 10*3/uL (ref 150–400)
RBC: 4.42 MIL/uL (ref 3.87–5.11)
RDW: 13.7 % (ref 11.5–15.5)
WBC: 6 10*3/uL (ref 4.0–10.5)

## 2015-10-03 LAB — HCG, SERUM, QUALITATIVE: Preg, Serum: NEGATIVE

## 2015-10-03 MED ORDER — AMITRIPTYLINE HCL 25 MG PO TABS
25.0000 mg | ORAL_TABLET | Freq: Every day | ORAL | Status: DC
  Administered 2015-10-03: 25 mg via ORAL
  Filled 2015-10-03: qty 1

## 2015-10-03 MED ORDER — MIDAZOLAM HCL 2 MG/2ML IJ SOLN
INTRAMUSCULAR | Status: AC
  Filled 2015-10-03: qty 6

## 2015-10-03 MED ORDER — SODIUM CHLORIDE 0.9% FLUSH: 3.0000 mL | Freq: Two times a day (BID) | INTRAVENOUS | Status: DC

## 2015-10-03 MED ORDER — KETOROLAC TROMETHAMINE 30 MG/ML IJ SOLN
INTRAMUSCULAR | Status: AC
  Administered 2015-10-03: 30 mg via INTRAVENOUS
  Filled 2015-10-03: qty 1

## 2015-10-03 MED ORDER — MIDAZOLAM HCL 2 MG/2ML IJ SOLN
INTRAMUSCULAR | Status: AC
  Filled 2015-10-03: qty 4

## 2015-10-03 MED ORDER — HYDROMORPHONE 1 MG/ML IV SOLN
INTRAVENOUS | Status: DC
  Administered 2015-10-03: 12:00:00 via INTRAVENOUS
  Administered 2015-10-03: 2.1 mg via INTRAVENOUS
  Administered 2015-10-03: 1.5 mg via INTRAVENOUS
  Administered 2015-10-04: 2.1 mg via INTRAVENOUS
  Administered 2015-10-04: 2.7 mg via INTRAVENOUS
  Administered 2015-10-04: 3.6 mg via INTRAVENOUS

## 2015-10-03 MED ORDER — MONTELUKAST SODIUM 10 MG PO TABS
10.0000 mg | ORAL_TABLET | Freq: Every day | ORAL | Status: DC
  Administered 2015-10-03: 10 mg via ORAL
  Filled 2015-10-03: qty 1

## 2015-10-03 MED ORDER — KETOROLAC TROMETHAMINE 30 MG/ML IJ SOLN
30.0000 mg | Freq: Four times a day (QID) | INTRAMUSCULAR | Status: DC
  Administered 2015-10-03 – 2015-10-04 (×4): 30 mg via INTRAVENOUS
  Filled 2015-10-03 (×4): qty 1

## 2015-10-03 MED ORDER — DOCUSATE SODIUM 100 MG PO CAPS
100.0000 mg | ORAL_CAPSULE | Freq: Two times a day (BID) | ORAL | Status: DC
  Administered 2015-10-03 – 2015-10-04 (×3): 100 mg via ORAL
  Filled 2015-10-03 (×2): qty 1

## 2015-10-03 MED ORDER — SODIUM CHLORIDE 0.9 % IV SOLN: 250.0000 mL | INTRAVENOUS | Status: DC | PRN

## 2015-10-03 MED ORDER — INSULIN ASPART 100 UNIT/ML ~~LOC~~ SOLN
0.0000 [IU] | Freq: Three times a day (TID) | SUBCUTANEOUS | Status: DC
  Administered 2015-10-03: 7 [IU] via SUBCUTANEOUS
  Administered 2015-10-04: 3 [IU] via SUBCUTANEOUS

## 2015-10-03 MED ORDER — IOPAMIDOL (ISOVUE-300) INJECTION 61%
50.0000 mL | Freq: Once | INTRAVENOUS | Status: AC | PRN
  Administered 2015-10-03: 40 mL via INTRA_ARTERIAL

## 2015-10-03 MED ORDER — LIDOCAINE HCL 1 % IJ SOLN
INTRAMUSCULAR | Status: AC
  Filled 2015-10-03: qty 20

## 2015-10-03 MED ORDER — LORATADINE 10 MG PO TABS
10.0000 mg | ORAL_TABLET | Freq: Every day | ORAL | Status: DC
Start: 2015-10-03 — End: 2015-10-03

## 2015-10-03 MED ORDER — SPIRONOLACTONE 25 MG PO TABS
50.0000 mg | ORAL_TABLET | Freq: Every day | ORAL | Status: DC
  Administered 2015-10-03 – 2015-10-04 (×2): 50 mg via ORAL
  Filled 2015-10-03 (×2): qty 2

## 2015-10-03 MED ORDER — POTASSIUM CHLORIDE CRYS ER 20 MEQ PO TBCR
20.0000 meq | EXTENDED_RELEASE_TABLET | Freq: Every day | ORAL | Status: DC
  Administered 2015-10-03 – 2015-10-04 (×2): 20 meq via ORAL
  Filled 2015-10-03 (×2): qty 1

## 2015-10-03 MED ORDER — ALPRAZOLAM 0.5 MG PO TABS: 0.5000 mg | ORAL_TABLET | Freq: Two times a day (BID) | ORAL | Status: DC | PRN

## 2015-10-03 MED ORDER — IRBESARTAN 150 MG PO TABS
300.0000 mg | ORAL_TABLET | Freq: Every day | ORAL | Status: DC
  Administered 2015-10-04: 300 mg via ORAL
  Filled 2015-10-03 (×2): qty 2

## 2015-10-03 MED ORDER — PROMETHAZINE HCL 25 MG RE SUPP: 25.0000 mg | Freq: Three times a day (TID) | RECTAL | Status: DC | PRN

## 2015-10-03 MED ORDER — IOPAMIDOL (ISOVUE-300) INJECTION 61%
100.0000 mL | Freq: Once | INTRAVENOUS | Status: AC | PRN
  Administered 2015-10-03: 15 mL via INTRA_ARTERIAL

## 2015-10-03 MED ORDER — SODIUM CHLORIDE 0.9 % IV SOLN: INTRAVENOUS | Status: DC

## 2015-10-03 MED ORDER — IOPAMIDOL (ISOVUE-300) INJECTION 61%
100.0000 mL | Freq: Once | INTRAVENOUS | Status: AC | PRN
  Administered 2015-10-03: 60 mL via INTRA_ARTERIAL

## 2015-10-03 MED ORDER — METFORMIN HCL ER 500 MG PO TB24
1000.0000 mg | ORAL_TABLET | Freq: Two times a day (BID) | ORAL | Status: DC
  Filled 2015-10-03: qty 2

## 2015-10-03 MED ORDER — LIDOCAINE HCL 1 % IJ SOLN
INTRAMUSCULAR | Status: AC | PRN
  Administered 2015-10-03: 5 mL via INTRADERMAL

## 2015-10-03 MED ORDER — MIDAZOLAM HCL 2 MG/2ML IJ SOLN
INTRAMUSCULAR | Status: AC | PRN
  Administered 2015-10-03 (×8): 1 mg via INTRAVENOUS
  Administered 2015-10-03: 0.5 mg via INTRAVENOUS
  Administered 2015-10-03 (×2): 1 mg via INTRAVENOUS

## 2015-10-03 MED ORDER — SODIUM CHLORIDE 0.9% FLUSH: 3.0000 mL | INTRAVENOUS | Status: DC | PRN

## 2015-10-03 MED ORDER — FENTANYL CITRATE (PF) 100 MCG/2ML IJ SOLN
INTRAMUSCULAR | Status: AC
  Filled 2015-10-03: qty 4

## 2015-10-03 MED ORDER — ONDANSETRON HCL 4 MG/2ML IJ SOLN: 4.0000 mg | Freq: Four times a day (QID) | INTRAMUSCULAR | Status: DC | PRN

## 2015-10-03 MED ORDER — SODIUM CHLORIDE 0.9% FLUSH: 9.0000 mL | INTRAVENOUS | Status: DC | PRN

## 2015-10-03 MED ORDER — DIPHENHYDRAMINE HCL 50 MG/ML IJ SOLN: 12.5000 mg | Freq: Four times a day (QID) | INTRAMUSCULAR | Status: DC | PRN

## 2015-10-03 MED ORDER — DIPHENHYDRAMINE HCL 12.5 MG/5ML PO ELIX: 12.5000 mg | ORAL_SOLUTION | Freq: Four times a day (QID) | ORAL | Status: DC | PRN

## 2015-10-03 MED ORDER — FLUTICASONE PROPIONATE 50 MCG/ACT NA SUSP: 1.0000 | Freq: Every day | NASAL | Status: DC

## 2015-10-03 MED ORDER — NITROGLYCERIN 1 MG/10 ML FOR IR/CATH LAB
INTRA_ARTERIAL | Status: AC | PRN
  Administered 2015-10-03: 200 ug via INTRA_ARTERIAL

## 2015-10-03 MED ORDER — KETOROLAC TROMETHAMINE 30 MG/ML IJ SOLN
30.0000 mg | INTRAMUSCULAR | Status: AC
  Administered 2015-10-03: 30 mg via INTRAVENOUS

## 2015-10-03 MED ORDER — HYDROMORPHONE HCL 2 MG/ML IJ SOLN
INTRAMUSCULAR | Status: AC
  Filled 2015-10-03: qty 1

## 2015-10-03 MED ORDER — NALOXONE HCL 0.4 MG/ML IJ SOLN: 0.4000 mg | INTRAMUSCULAR | Status: DC | PRN

## 2015-10-03 MED ORDER — ADULT MULTIVITAMIN W/MINERALS CH
1.0000 | ORAL_TABLET | Freq: Every day | ORAL | Status: DC
  Administered 2015-10-03 – 2015-10-04 (×2): 1 via ORAL
  Filled 2015-10-03 (×2): qty 1

## 2015-10-03 MED ORDER — FUROSEMIDE 40 MG PO TABS
40.0000 mg | ORAL_TABLET | Freq: Two times a day (BID) | ORAL | Status: DC
  Administered 2015-10-03 – 2015-10-04 (×2): 40 mg via ORAL
  Filled 2015-10-03 (×2): qty 1

## 2015-10-03 MED ORDER — VANCOMYCIN HCL 10 G IV SOLR
1500.0000 mg | INTRAVENOUS | Status: AC
  Administered 2015-10-03: 1500 mg via INTRAVENOUS
  Filled 2015-10-03: qty 1500

## 2015-10-03 MED ORDER — LEVOTHYROXINE SODIUM 50 MCG PO TABS
50.0000 ug | ORAL_TABLET | Freq: Every day | ORAL | Status: DC
  Administered 2015-10-03 – 2015-10-04 (×2): 50 ug via ORAL
  Filled 2015-10-03 (×2): qty 1

## 2015-10-03 MED ORDER — HYDROMORPHONE 1 MG/ML IV SOLN
INTRAVENOUS | Status: AC
  Filled 2015-10-03: qty 25

## 2015-10-03 MED ORDER — HYDROMORPHONE HCL 1 MG/ML IJ SOLN
INTRAMUSCULAR | Status: AC | PRN
  Administered 2015-10-03 (×2): 1 mg via INTRAVENOUS
  Administered 2015-10-03 (×2): 0.5 mg via INTRAVENOUS
  Administered 2015-10-03: 1 mg via INTRAVENOUS

## 2015-10-03 MED ORDER — MIDAZOLAM HCL 2 MG/2ML IJ SOLN
INTRAMUSCULAR | Status: AC
  Filled 2015-10-03: qty 2

## 2015-10-03 MED ORDER — AMLODIPINE BESYLATE-VALSARTAN 10-320 MG PO TABS
1.0000 | ORAL_TABLET | Freq: Every day | ORAL | Status: DC
Start: 2015-10-03 — End: 2015-10-03

## 2015-10-03 MED ORDER — PROMETHAZINE HCL 25 MG PO TABS: 25.0000 mg | ORAL_TABLET | Freq: Three times a day (TID) | ORAL | Status: DC | PRN

## 2015-10-03 MED ORDER — ESCITALOPRAM OXALATE 10 MG PO TABS
10.0000 mg | ORAL_TABLET | Freq: Every day | ORAL | Status: DC
  Administered 2015-10-03 – 2015-10-04 (×2): 10 mg via ORAL
  Filled 2015-10-03 (×2): qty 1

## 2015-10-03 MED ORDER — AMLODIPINE BESYLATE 10 MG PO TABS
10.0000 mg | ORAL_TABLET | Freq: Every day | ORAL | Status: DC
  Administered 2015-10-04: 10 mg via ORAL
  Filled 2015-10-03: qty 1

## 2015-10-03 MED ORDER — FENTANYL CITRATE (PF) 100 MCG/2ML IJ SOLN
INTRAMUSCULAR | Status: AC | PRN
  Administered 2015-10-03 (×2): 25 ug via INTRAVENOUS
  Administered 2015-10-03: 50 ug via INTRAVENOUS
  Administered 2015-10-03 (×4): 25 ug via INTRAVENOUS

## 2015-10-03 MED ORDER — MAGNESIUM 250 MG PO TABS: 1.0000 | ORAL_TABLET | Freq: Every day | ORAL | Status: DC

## 2015-10-03 MED ORDER — INSULIN ASPART 100 UNIT/ML ~~LOC~~ SOLN
6.0000 [IU] | Freq: Three times a day (TID) | SUBCUTANEOUS | Status: DC
  Administered 2015-10-04: 6 [IU] via SUBCUTANEOUS

## 2015-10-03 NOTE — Sedation Documentation (Signed)
5 Fr Sheath removed from R femoral artery by Dr. Annamaria Boots. Hemostasis achieved with Exseal closure device. Groin level 0.

## 2015-10-03 NOTE — Procedures (Signed)
Successful UFE No comp Stable Full report in PACS EBL<10CC Overnight recovery and outpt f/u in 1 month

## 2015-10-03 NOTE — H&P (Signed)
Patient ID: Anita Rodgers, female   DOB: May 10, 1970, 45 y.o.   MRN: [id number]    Referring Physician(s): Cousins,Sheronette  Supervising Physician: Daryll Brod  Patient Status:  Outpatient TBA  Chief Complaint:  Symptomatic uterine fibroids  Subjective: Patient familiar to IR service from prior consultation on 07/24/15 with Dr. Annamaria Boots for treatment options regarding symptomatic uterine fibroids (see full consult note for additional details). She was deemed an appropriate candidate for bilateral uterine artery embolization and presents today for the procedure. She currently denies fever, chest pain,  cough, nausea, vomiting, dysuria or hematuria. She does have occasional headaches, some dyspnea with exertion, intermittent abdominal/pelvic pain/bloating/urinary frequency and left knee discomfort/paresthesias. Additional hx as below. Past Medical History  Diagnosis Date  . Diabetes mellitus   . Hypertension   . Sleep apnea     on CPAP  . Anxiety   . Depression   . Complication of anesthesia     pt states she has malignant hyperthermia  . GERD (gastroesophageal reflux disease)   . Hypothyroidism   . Headache(784.0)   . Arthritis     knees  . Seasonal allergies   . SVD (spontaneous vaginal delivery)     x 1  . Family history of anesthesia complication     malignant hyperthermia  . 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 Equinus deformity of foot     Bilateral  . Fibroids    Past Surgical History  Procedure Laterality Date  . Knee surgery      x 2 left      Allergies: Penicillins  Medications: Prior to Admission medications   Medication Sig Start Date End Date Taking? Authorizing Provider  acetaminophen (TYLENOL) 500 MG tablet Take 1,000 mg by mouth 2 (two) times daily as needed. For pain     Yes Historical Provider, MD  ALPRAZolam (XANAX) 0.5 MG tablet Take 0.5 mg by mouth every 12 (twelve) hours as needed. For anxiety    Yes Historical Provider, MD    amitriptyline (ELAVIL) 25 MG tablet Take 25 mg by mouth at bedtime.    Yes Historical Provider, MD  doxycycline (VIBRA-TABS) 100 MG tablet Take 100 mg by mouth daily. Reported on 07/24/2015   Yes Historical Provider, MD  Escitalopram Oxalate (LEXAPRO PO) Take 10 mg by mouth daily.    Yes Historical Provider, MD  EXFORGE 10-320 MG per tablet TAKE 1 TABLET BY MOUTH EVERY DAY**NEEDS APPOINTMENT FOR FURTHER REFILLS** 09/27/14  Yes Pixie Casino, MD  fluticasone (FLONASE) 50 MCG/ACT nasal spray Place 1 spray into the nose daily.     Yes Historical Provider, MD  furosemide (LASIX) 40 MG tablet Take 40 mg by mouth 2 (two) times daily.   Yes Historical Provider, MD  HYDROmorphone (DILAUDID) 4 MG tablet Take 4 mg by mouth every 6 (six) hours as needed for pain.   Yes Historical Provider, MD  HYDROmorphone HCl (EXALGO) 8 MG T24A SR tablet Take 24 mg by mouth daily.   Yes Historical Provider, MD  levothyroxine (SYNTHROID, LEVOTHROID) 50 MCG tablet Take 50 mcg by mouth daily.   Yes Historical Provider, MD  metFORMIN (GLUCOPHAGE-XR) 500 MG 24 hr tablet Take 1,000 mg by mouth 2 (two) times daily.    Yes Historical Provider, MD  montelukast (SINGULAIR) 10 MG tablet Take 10 mg by mouth at bedtime.     Yes Historical Provider, MD  Multiple Vitamin (MULITIVITAMIN WITH MINERALS) TABS Take 1 tablet by mouth daily.   Yes Historical  Provider, MD  potassium chloride SA (K-DUR,KLOR-CON) 20 MEQ tablet Take 20 mEq by mouth daily.   Yes Historical Provider, MD  spironolactone (ALDACTONE) 50 MG tablet Take 50 mg by mouth daily.   Yes Historical Provider, MD  loratadine (CLARITIN) 10 MG tablet Take 10 mg by mouth daily.      Historical Provider, MD  Magnesium 250 MG TABS Take 1 tablet by mouth daily.    Historical Provider, MD  Nebivolol HCl (BYSTOLIC) 20 MG TABS Take 1 tablet by mouth at bedtime.     Historical Provider, MD  NON FORMULARY Take 4 tablets by mouth daily. Omega XL    Historical Provider, MD  NON FORMULARY at  bedtime. CPAP    Historical Provider, MD  Polyethyl Glycol-Propyl Glycol (SYSTANE ULTRA) 0.4-0.3 % SOLN Place 1 drop into both eyes daily as needed. For dry eyes    Historical Provider, MD  progesterone (PROMETRIUM) 200 MG capsule Take 200 mg by mouth at bedtime.     Historical Provider, MD  SUMAtriptan (IMITREX) 50 MG tablet Take 50 mg by mouth once as needed. For migraine     Historical Provider, MD     Vital Signs: BP 188/88 mmHg  Pulse 100  Temp(Src) 98.6 F (37 C) (Oral)  Resp 18  SpO2 98%  Physical Exam patient awake, alert. Chest clear to auscultation bilaterally. Heart with regular rate and rhythm. Abdomen obese, soft, positive bowel sounds, mild pelvic tenderness to palpation. Lower extremities with no significant edema.  Imaging: No results found.  Labs:  CBC: No results for input(s): WBC, HGB, HCT, PLT in the last 8760 hours.  COAGS: No results for input(s): INR, APTT in the last 8760 hours.  BMP:  Recent Labs  07/24/15 1051  BUN 8  CREATININE 0.72  GFRNONAA >89  GFRAA >89    LIVER FUNCTION TESTS: No results for input(s): BILITOT, AST, ALT, ALKPHOS, PROT, ALBUMIN in the last 8760 hours.  Assessment and Plan: Patient with history of symptomatic uterine fibroids and recent consultation with IR on 07/24/15 to discuss treatment options. She was deemed an appropriate candidate for bilateral uterine artery embolization and presents today for the procedure. Details/risks of procedure, including but not limited to, internal bleeding, infection, nontarget embolization, contrast nephropathy  discussed with patient/mother with their understanding and consent. Postprocedure the patient will be admitted for overnight observation for pain control.Labs pending.   Electronically Signed: D. Rowe Robert 10/03/2015, 8:13 AM   I spent a total of 30 minutes at the the patient's bedside AND on the patient's hospital floor or unit, greater than 50% of which was  counseling/coordinating care for bilateral uterine artery embolization

## 2015-10-04 ENCOUNTER — Other Ambulatory Visit: Payer: Self-pay | Admitting: Radiology

## 2015-10-04 DIAGNOSIS — D259 Leiomyoma of uterus, unspecified: Secondary | ICD-10-CM | POA: Diagnosis not present

## 2015-10-04 LAB — GLUCOSE, CAPILLARY
GLUCOSE-CAPILLARY: 136 mg/dL — AB (ref 65–99)
GLUCOSE-CAPILLARY: 167 mg/dL — AB (ref 65–99)

## 2015-10-04 MED ORDER — HYDROCODONE-ACETAMINOPHEN 5-325 MG PO TABS: 1.0000 | ORAL_TABLET | ORAL | Status: DC | PRN

## 2015-10-04 NOTE — Discharge Instructions (Signed)
Uterine Artery Embolization for Fibroids, Care After °Refer to this sheet in the next few weeks. These instructions provide you with information on caring for yourself after your procedure. Your health care provider may also give you more specific instructions. Your treatment has been planned according to current medical practices, but problems sometimes occur. Call your health care provider if you have any problems or questions after your procedure. °WHAT TO EXPECT AFTER THE PROCEDURE °After your procedure, it is typical to have cramping in the pelvis. You will be given pain medicine to control it. °HOME CARE INSTRUCTIONS °· Only take over-the-counter or prescription medicines for pain, discomfort, or fever as directed by your health care provider. °· Do not take aspirin. It can cause bleeding. °· Follow your health care provider's advice regarding medicines given to you, diet, activity, and when to begin sexual activity. °· See your health care provider for follow-up care as directed. °SEEK MEDICAL CARE IF: °· You have a fever. °· You have redness, swelling, and pain around your incision site. °· You have pus draining from your incision. °· You have a rash. °SEEK IMMEDIATE MEDICAL CARE IF: °· You have bleeding from your incision site. °· You have difficulty breathing. °· You have chest pain. °· You have severe abdominal pain. °· You have leg pain. °· You become dizzy and faint. °  °This information is not intended to replace advice given to you by your health care provider. Make sure you discuss any questions you have with your health care provider. °  °Document Released: 12/29/2012 Document Reviewed: 12/29/2012 °[employer] Interactive Patient Education ©2016 [employer] ° °

## 2015-10-04 NOTE — Discharge Summary (Signed)
Patient ID: Anita Rodgers MRN: [id number] DOB/AGE: 1971-02-16 45 y.o.  Admit date: 10/03/2015 Discharge date: 10/04/2015  Supervising Physician: Anita Rodgers  Admission Diagnoses: Symptomatic uterine fibroids  Discharge Diagnoses: Symptomatic uterine fibroids, status post successful bilateral uterine artery embolization on 10/03/15 Active Problems:   Fibroid uterus  Past Medical History  Diagnosis Date  . Diabetes mellitus   . Hypertension   . Sleep apnea     on CPAP  . Anxiety   . Depression   . Complication of anesthesia     pt states she has malignant hyperthermia  . GERD (gastroesophageal reflux disease)   . Hypothyroidism   . Headache(784.0)   . Arthritis     knees  . Seasonal allergies   . SVD (spontaneous vaginal delivery)     x 1  . Family history of anesthesia complication     malignant hyperthermia  . 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 Equinus deformity of foot     Bilateral  . Fibroids    Past Surgical History  Procedure Laterality Date  . Knee surgery      x 2 left      Discharged Condition: good  Hospital Course: Mrs. Anita Rodgers is a 45 year old female who was referred to the IR service on 07/24/15 for consultation regarding treatment options for symptomatic uterine fibroids. She was deemed an appropriate candidate for bilateral uterine artery embolization. On 10/03/15 she underwent successful bilateral uterine artery embolization by Dr. Annamaria Rodgers. The procedure was performed without immediate complications and she was subsequently admitted for overnight observation for pain control. Post procedure the patient did experience expected intermittent pelvic cramping along with some occasional nausea and vomiting. She was placed on a Dilaudid PCA pump and given antiemetics as needed. On the day of discharge the patient was stable. She was able to void, ambulate and tolerate her diet without significant difficulty. She did continue to have some  intermittent pelvic cramping and was told this will persist but should gradually improve over time. She was given prescriptions for Norco 5/325, #30, no refills, 1-2 tablets every 4-6 hours as needed for moderate to severe pain, Phenergan 25 mg, #10, no refills, 1 tablet every 6 hours as needed for nausea; ibuprofen 600 mg, #20, 1 tablet every 6 hours for the next 5 days; Colace 100 mg, #20, no refills, 1 tablet twice daily as needed for constipation. She will be scheduled for follow-up in the IR clinic with Dr. Annamaria Rodgers in 1 month. She was told to resume her current home medications with the exception of metformin which she will begin on 7/14. She will continue her current gynecologic care with Dr. Garwin Rodgers . She was told to contact our service with any additional questions or concerns.  Consults: none  Significant Diagnostic Studies:  Results for orders placed or performed during the hospital encounter of [id number]  Basic metabolic panel  Result Value Ref Range   Sodium 140 135 - 145 mmol/L   Potassium 3.7 3.5 - 5.1 mmol/L   Chloride 107 101 - 111 mmol/L   CO2 26 22 - 32 mmol/L   Glucose, Bld 153 (H) 65 - 99 mg/dL   BUN 9 6 - 20 mg/dL   Creatinine, Ser 0.76 0.44 - 1.00 mg/dL   Calcium 8.9 8.9 - 10.3 mg/dL   GFR calc non Af Amer >60 >60 mL/min   GFR calc Af Amer >60 >60 mL/min   Anion gap 7 5 - 15  CBC with Differential/Platelet  Result Value Ref Range   WBC 6.0 4.0 - 10.5 K/uL   RBC 4.42 3.87 - 5.11 MIL/uL   Hemoglobin 10.8 (L) 12.0 - 15.0 g/dL   HCT 34.2 (L) 36.0 - 46.0 %   MCV 77.4 (L) 78.0 - 100.0 fL   MCH 24.4 (L) 26.0 - 34.0 pg   MCHC 31.6 30.0 - 36.0 g/dL   RDW 13.7 11.5 - 15.5 %   Platelets 371 150 - 400 K/uL   Neutrophils Relative % 52 %   Neutro Abs 3.1 1.7 - 7.7 K/uL   Lymphocytes Relative 36 %   Lymphs Abs 2.2 0.7 - 4.0 K/uL   Monocytes Relative 9 %   Monocytes Absolute 0.5 0.1 - 1.0 K/uL   Eosinophils Relative 3 %   Eosinophils Absolute 0.2 0.0 - 0.7 K/uL   Basophils  Relative 0 %   Basophils Absolute 0.0 0.0 - 0.1 K/uL  hCG, serum, qualitative  Result Value Ref Range   Preg, Serum NEGATIVE NEGATIVE  Protime-INR  Result Value Ref Range   Prothrombin Time 13.6 11.6 - 15.2 seconds   INR 1.06 0.00 - 1.49  Glucose, capillary  Result Value Ref Range   Glucose-Capillary 233 (H) 65 - 99 mg/dL   Comment 1 Notify RN    Comment 2 Document in Chart   Glucose, capillary  Result Value Ref Range   Glucose-Capillary 177 (H) 65 - 99 mg/dL  Glucose, capillary  Result Value Ref Range   Glucose-Capillary 136 (H) 65 - 99 mg/dL  Glucose, capillary  Result Value Ref Range   Glucose-Capillary 167 (H) 65 - 99 mg/dL     Treatments: Successful bilateral uterine artery embolization on 10/03/15  Discharge Exam: Blood pressure 161/94, pulse 98, temperature 98.9 F (37.2 C), temperature source Oral, resp. rate 20, last menstrual period 09/26/2015, SpO2 98 %. Patient awake, alert. Chest clear to auscultation bilaterally. Heart with regular rate and rhythm. Abdomen obese, soft, positive bowel sounds, mild pelvic tenderness to palpation. Puncture site right common femoral artery soft, mildly tender, no distinct hematoma, intact distal pulses; lower extremities with full range of motion  Disposition: home  Discharge Instructions    Call MD for:  difficulty breathing, headache or visual disturbances    Complete by:  As directed      Call MD for:  extreme fatigue    Complete by:  As directed      Call MD for:  hives    Complete by:  As directed      Call MD for:  persistant dizziness or light-headedness    Complete by:  As directed      Call MD for:  persistant nausea and vomiting    Complete by:  As directed      Call MD for:  redness, tenderness, or signs of infection (pain, swelling, redness, odor or green/yellow discharge around incision site)    Complete by:  As directed      Call MD for:  severe uncontrolled pain    Complete by:  As directed      Call MD for:   temperature >100.4    Complete by:  As directed      Change dressing (specify)    Complete by:  As directed   May change bandage over right groin and place Band-Aid to site for the next 2-3 days. May wash site with soap and water     Diet - low sodium heart healthy  Complete by:  As directed      Discharge instructions    Complete by:  As directed   May resume metformin on 10/05/15; do not take more than 1 type of pain medication at a time-use a 4-6 hour interval between pain medication use     Increase activity slowly    Complete by:  As directed      May shower / Bathe    Complete by:  As directed      May walk up steps    Complete by:  As directed             Medication List    TAKE these medications        acetaminophen 500 MG tablet  Commonly known as:  TYLENOL  Take 1,000 mg by mouth 2 (two) times daily as needed. For pain     ALPRAZolam 0.5 MG tablet  Commonly known as:  XANAX  Take 0.5 mg by mouth every 12 (twelve) hours as needed. For anxiety     amitriptyline 25 MG tablet  Commonly known as:  ELAVIL  Take 25 mg by mouth at bedtime.     BYSTOLIC 20 MG Tabs  Generic drug:  Nebivolol HCl  Take 1 tablet by mouth at bedtime.     doxycycline 100 MG tablet  Commonly known as:  VIBRA-TABS  Take 100 mg by mouth daily. Reported on 07/24/2015     EXFORGE 10-320 MG tablet  Generic drug:  amLODipine-valsartan  TAKE 1 TABLET BY MOUTH EVERY DAY**NEEDS APPOINTMENT FOR FURTHER REFILLS**     fluticasone 50 MCG/ACT nasal spray  Commonly known as:  FLONASE  Place 1 spray into the nose daily.     furosemide 40 MG tablet  Commonly known as:  LASIX  Take 40 mg by mouth 2 (two) times daily.     HYDROmorphone 4 MG tablet  Commonly known as:  DILAUDID  Take 4 mg by mouth every 6 (six) hours as needed for pain.     HYDROmorphone HCl 8 MG T24a SR tablet  Commonly known as:  EXALGO  Take 24 mg by mouth daily.     levothyroxine 50 MCG tablet  Commonly known as:  SYNTHROID,  LEVOTHROID  Take 50 mcg by mouth daily.     LEXAPRO PO  Take 10 mg by mouth daily.     loratadine 10 MG tablet  Commonly known as:  CLARITIN  Take 10 mg by mouth daily.     Magnesium 250 MG Tabs  Take 1 tablet by mouth daily.     metFORMIN 500 MG 24 hr tablet  Commonly known as:  GLUCOPHAGE-XR  Take 1,000 mg by mouth 2 (two) times daily.     montelukast 10 MG tablet  Commonly known as:  SINGULAIR  Take 10 mg by mouth at bedtime.     multivitamin with minerals Tabs tablet  Take 1 tablet by mouth daily.     NON FORMULARY  Take 4 tablets by mouth daily. Omega XL     NON FORMULARY  at bedtime. CPAP     potassium chloride SA 20 MEQ tablet  Commonly known as:  K-DUR,KLOR-CON  Take 20 mEq by mouth daily.     progesterone 200 MG capsule  Commonly known as:  PROMETRIUM  Take 200 mg by mouth at bedtime.     spironolactone 50 MG tablet  Commonly known as:  ALDACTONE  Take 50 mg by mouth daily.     SUMAtriptan  50 MG tablet  Commonly known as:  IMITREX  Take 50 mg by mouth once as needed. For migraine     SYSTANE ULTRA 0.4-0.3 % Soln  Generic drug:  Polyethyl Glycol-Propyl Glycol  Place 1 drop into both eyes daily as needed. For dry eyes           Follow-up Information    Follow up with Greggory Keen, MD.   Specialty:  Interventional Radiology   Why:  Radiology will call you with follow up appointment with Dr. Annamaria Rodgers in the IR clinic in one month; please call [phone] or [phone] with any questions or concerns   Contact information:   [address] [place] [address] [phone]       Follow up with Marvene Staff, MD.   Specialty:  Obstetrics and Gynecology   Why:  Follow-up with Dr. Garwin Rodgers as scheduled   Contact information:   [address] [place] [address] [phone]        Electronically Signed: D. Rowe Robert 10/04/2015, 1:37 PM   I have spent less than 30 minutes discharging Anita Rodgers.

## 2015-10-10 ENCOUNTER — Other Ambulatory Visit: Payer: Medicare Other

## 2015-10-11 ENCOUNTER — Other Ambulatory Visit: Payer: Medicare Other

## 2015-11-06 ENCOUNTER — Ambulatory Visit
Admission: RE | Admit: 2015-11-06 | Discharge: 2015-11-06 | Disposition: A | Discharge status: Home / Self Care | Payer: Medicare Other | Source: Ambulatory Visit | Attending: Radiology | Admitting: Radiology

## 2015-11-06 DIAGNOSIS — D259 Leiomyoma of uterus, unspecified: Secondary | ICD-10-CM

## 2015-11-06 HISTORY — PX: IR GENERIC HISTORICAL: IMG1180011

## 2015-11-06 NOTE — Progress Notes (Signed)
Patient states her periods still are quite heavy, though far fewer clots being passed.  Flow is like water gushing.  The cycle that started 10/30/15 is the first one since the procedure. Cramping is the same but has new onset of left back/flank pain with this menstrual cycle.  Ibuprofen does help with this.  Brita Romp, RN

## 2015-11-06 NOTE — Progress Notes (Signed)
Patient ID: Anita Rodgers, female   DOB: 08-19-1970, 45 y.o.   MRN: [id number]   Referring Physician(s): Cousins,S  Chief Complaint: The patient is seen in follow up today s/p bilateral uterine artery embolization on 10/03/15  History of present illness: Anita Rodgers is a 45 year old female who was referred to the IR service on 07/24/15 by Dr. Garwin Brothers for consultation regarding treatment options for symptomatic uterine fibroids. She was deemed an appropriate candidate for bilateral uterine artery embolization and on 10/03/15 she underwent successful bilateral uterine artery embolization by Dr. Annamaria Boots. The procedure was performed without immediate complications and she was discharged home on 10/04/15. She presents again today for routine outpatient follow-up. She states that the first few days after discharge she experienced some constipation, occasional nausea and vomiting as well as pelvic cramping. This subsided after few days. She  started her menstrual cycle this past Tuesday and continues to have menorrhagia but decreased clots. She's also noticed some new left lower back discomfort. 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 She continues to take ibuprofen every 4 hours as well as Exalgo 3 pills once daily for pain. She currently denies fever, headache, chest pain, dyspnea, cough, hematuria or dysuria. She denies any complaints of pain, swelling or bleeding from puncture site right common femoral artery.   Past Medical History:  Diagnosis Date  . Anxiety   . Arthritis    knees  . Complication of anesthesia    pt states she has malignant hyperthermia  . Depression   . Diabetes mellitus   . Equinus deformity of foot    Bilateral  . Family history of anesthesia complication    malignant hyperthermia  . Fibroids   . GERD (gastroesophageal reflux disease)   .  Headache(784.0)   . Hypertension   . Hypothyroidism   . Malignant hyperthemia 2007, 2011   x 2 episodes  . Seasonal allergies   . Sleep apnea    on CPAP  . SVD (spontaneous vaginal delivery)    x 1    Past Surgical History:  Procedure Laterality Date  . KNEE SURGERY     x 2 left    Allergies: Penicillins  Medications: Prior to Admission medications   Medication Sig Start Date End Date Taking? Authorizing Provider  acetaminophen (TYLENOL) 500 MG tablet Take 1,000 mg by mouth 2 (two) times daily as needed. For pain     Yes Historical Provider, MD  ALPRAZolam (XANAX) 0.5 MG tablet Take 0.5 mg by mouth every 12 (twelve) hours as needed. For anxiety    Yes Historical Provider, MD  amitriptyline (ELAVIL) 25 MG tablet Take 25 mg by mouth at bedtime.    Yes Historical Provider, MD  doxycycline (VIBRA-TABS) 100 MG tablet Take 100 mg by mouth daily. Reported on 07/24/2015   Yes Historical Provider, MD  Escitalopram Oxalate (LEXAPRO PO) Take 10 mg by mouth daily.    Yes Historical Provider, MD  EXFORGE 10-320 MG per tablet TAKE 1 TABLET BY MOUTH EVERY DAY**NEEDS APPOINTMENT FOR FURTHER REFILLS** 09/27/14  Yes Pixie Casino, MD  fluticasone (FLONASE) 50 MCG/ACT nasal spray Place 1 spray into the nose daily.     Yes Historical Provider, MD  furosemide (LASIX) 40 MG tablet Take 40 mg by mouth 2 (two) times daily.   Yes Historical Provider, MD  HYDROmorphone HCl (EXALGO) 8 MG  T24A SR tablet Take 24 mg by mouth daily.   Yes Historical Provider, MD  levothyroxine (SYNTHROID, LEVOTHROID) 50 MCG tablet Take 50 mcg by mouth daily.   Yes Historical Provider, MD  loratadine (CLARITIN) 10 MG tablet Take 10 mg by mouth daily.     Yes Historical Provider, MD  metFORMIN (GLUCOPHAGE-XR) 500 MG 24 hr tablet Take 1,000 mg by mouth 2 (two) times daily.    Yes Historical Provider, MD  montelukast (SINGULAIR) 10 MG tablet Take 10 mg by mouth at bedtime.     Yes Historical Provider, MD  Multiple Vitamin  (MULITIVITAMIN WITH MINERALS) TABS Take 1 tablet by mouth daily.   Yes Historical Provider, MD  NON FORMULARY Take 4 tablets by mouth daily. Omega XL   Yes Historical Provider, MD  NON FORMULARY at bedtime. CPAP   Yes Historical Provider, MD  potassium chloride SA (K-DUR,KLOR-CON) 20 MEQ tablet Take 20 mEq by mouth daily.   Yes Historical Provider, MD  spironolactone (ALDACTONE) 50 MG tablet Take 50 mg by mouth daily.   Yes Historical Provider, MD  SUMAtriptan (IMITREX) 50 MG tablet Take 50 mg by mouth once as needed. For migraine    Yes Historical Provider, MD  HYDROmorphone (DILAUDID) 4 MG tablet Take 4 mg by mouth every 6 (six) hours as needed for pain.    Historical Provider, MD  Magnesium 250 MG TABS Take 1 tablet by mouth daily.    Historical Provider, MD  Nebivolol HCl (BYSTOLIC) 20 MG TABS Take 1 tablet by mouth at bedtime.     Historical Provider, MD  Polyethyl Glycol-Propyl Glycol (SYSTANE ULTRA) 0.4-0.3 % SOLN Place 1 drop into both eyes daily as needed. For dry eyes    Historical Provider, MD  progesterone (PROMETRIUM) 200 MG capsule Take 200 mg by mouth at bedtime.     Historical Provider, MD     Family History  Problem Relation Age of Onset  . Hypertension Mother   . Hyperlipidemia Mother   . Depression Mother   . Hypertension Father   . Diabetes Father   . Hypertension Maternal Grandmother   . Diabetes Maternal Grandmother   . Stroke Maternal Grandmother   . Heart disease Maternal Grandmother   . Prostate cancer Maternal Grandfather   . Uterine cancer Paternal Grandmother   . Cervical cancer Paternal Grandmother   . Hypertension Sister     Social History   Social History  . Marital status: Single    Spouse name: N/A  . Number of children: N/A  . Years of education: N/A   Social History Main Topics  . Smoking status: Former Smoker    Packs/day: 0.50    Years: 3.00    Types: Cigarettes    Quit date: 03/24/1998  . Smokeless tobacco: Never Used  . Alcohol use No    . Drug use: No  . Sexual activity: Yes    Birth control/ protection: Condom   Other Topics Concern  . Not on file   Social History Narrative  . No narrative on file     Vital Signs: BP (!) 143/77 (BP Location: Right Arm, Patient Position: Sitting, Cuff Size: Normal)   Pulse 89   Temp 98.4 F (36.9 C)   Resp 18   Ht 5\' 6"  (1.676 m)   Wt 290 lb (131.5 kg)   LMP 10/30/2015 (Exact Date)   SpO2 98%   BMI 46.81 kg/m   Physical Exam patient awake, alert. Chest clear to auscultation bilaterally. Heart with  regular rate and rhythm. Abdomen obese, soft, positive bowel sounds, nontender. Left lower back region with mild tenderness to palpation but no evidence of ecchymosis, swelling or bleeding; lower extremities with 1-2+ edema bilaterally.  Imaging: No results found.  Labs:  CBC:  Recent Labs  10/03/15 0740  WBC 6.0  HGB 10.8*  HCT 34.2*  PLT 371    COAGS:  Recent Labs  10/03/15 0740  INR 1.06    BMP:  Recent Labs  07/24/15 1051 10/03/15 0740  NA  --  140  K  --  3.7  CL  --  107  CO2  --  26  GLUCOSE  --  153*  BUN 8 9  CALCIUM  --  8.9  CREATININE 0.72 0.76  GFRNONAA >89 >60  GFRAA >89 >60    LIVER FUNCTION TESTS: No results for input(s): BILITOT, AST, ALT, ALKPHOS, PROT, ALBUMIN in the last 8760 hours.  Assessment: Patient with history of symptomatic uterine fibroids, status post successful bilateral uterine artery embolization on 10/03/15. Patient continues to have menorrhagia (not unexpected at this stage of treatment) but decreased clots noted. Left lower back pain most likely secondary to recent stem cell harvesting on 10/13/15 at [facility]. No change in therapy at this point and continue with current home medications. She will be scheduled for follow-up MRI of the pelvis in 5 months. She was told to contact our service with any additional questions or concerns.   Signed: D. Rowe Robert 11/06/2015, 11:33 AM   Please refer to Dr. Fritz Pickerel  attestation of this note for management and plan.

## 2015-11-27 ENCOUNTER — Other Ambulatory Visit: Payer: Self-pay | Admitting: Internal Medicine

## 2015-12-25 ENCOUNTER — Encounter: Payer: Self-pay | Admitting: Interventional Radiology

## 2016-03-27 ENCOUNTER — Encounter: Payer: Self-pay | Admitting: Interventional Radiology

## 2016-04-16 ENCOUNTER — Other Ambulatory Visit: Payer: Self-pay | Admitting: Obstetrics and Gynecology

## 2016-04-16 ENCOUNTER — Other Ambulatory Visit (HOSPITAL_COMMUNITY): Payer: Self-pay | Admitting: Interventional Radiology

## 2016-04-16 DIAGNOSIS — D251 Intramural leiomyoma of uterus: Secondary | ICD-10-CM

## 2016-05-20 ENCOUNTER — Telehealth: Payer: Self-pay | Admitting: *Deleted

## 2016-05-20 NOTE — Telephone Encounter (Signed)
Unable to reach Ms. Centrella by phone to schedule 6 mth f/u [place]. Mailed letter.Cathren Harsh

## 2016-06-27 ENCOUNTER — Ambulatory Visit
Admission: RE | Admit: 2016-06-27 | Discharge: 2016-06-27 | Disposition: A | Discharge status: Home / Self Care | Payer: Medicare Other | Source: Ambulatory Visit | Attending: Obstetrics and Gynecology | Admitting: Obstetrics and Gynecology

## 2016-06-27 DIAGNOSIS — D251 Intramural leiomyoma of uterus: Secondary | ICD-10-CM

## 2016-06-27 MED ORDER — GADOBENATE DIMEGLUMINE 529 MG/ML IV SOLN
20.0000 mL | Freq: Once | INTRAVENOUS | Status: AC | PRN
  Administered 2016-06-27: 20 mL via INTRAVENOUS

## 2016-07-15 ENCOUNTER — Other Ambulatory Visit: Payer: Medicare Other

## 2017-07-15 IMAGING — XA IR US GUIDE VASC ACCESS RIGHT
13 of 20 series · 13 of 24 positions shown · IV contrast (IODINE)
Comparison: none

INDICATION: Symptomatic uterine fibroids with menorrhagia, dysmenorrhea, pelvic
pain and cramping.

[Series 1: care body 4 · 1 of 26 frames shown (1 of 13)]
[frame 4/26]
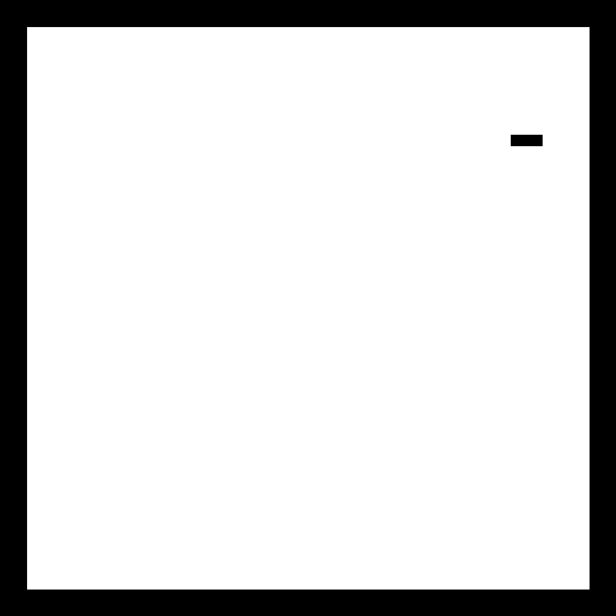

[Series 2: care body 4 · 1 of 28 frames shown (2 of 13)]
[frame 24/28]
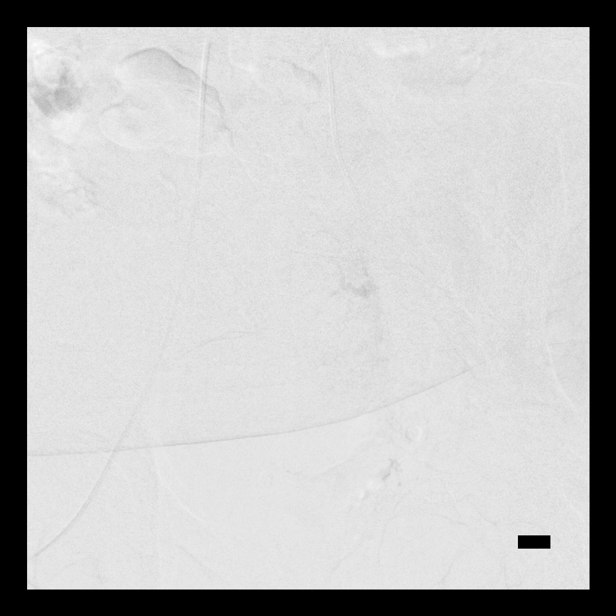

[Series 4: care body 4 · 1 of 24 frames shown (3 of 13)]
[frame 13/24]
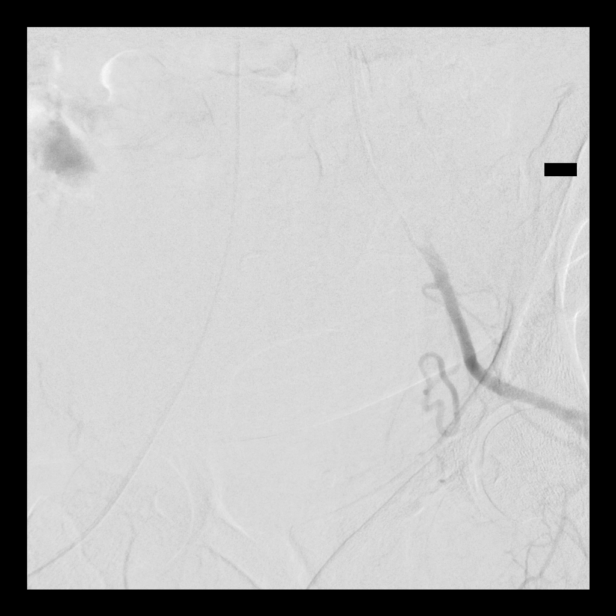

[Series 6: care body 4 · 1 of 56 frames shown (4 of 13)]
[frame 8/56]
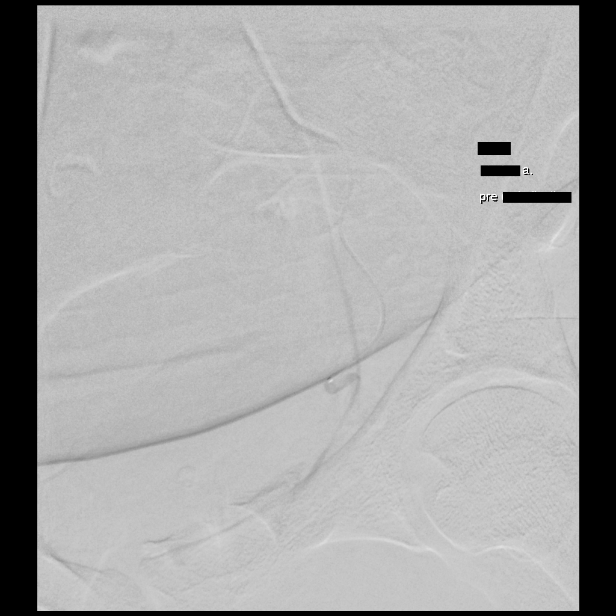

[Series 7: care body 4 · 1 of 58 frames shown (5 of 13)]
[frame 50/58]
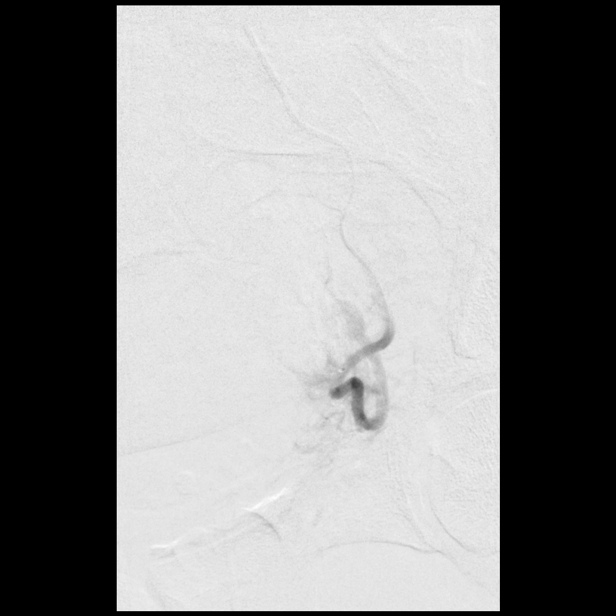

[Series 9: care body 4 · 1 of 24 frames shown (6 of 13)]
[frame 13/24]
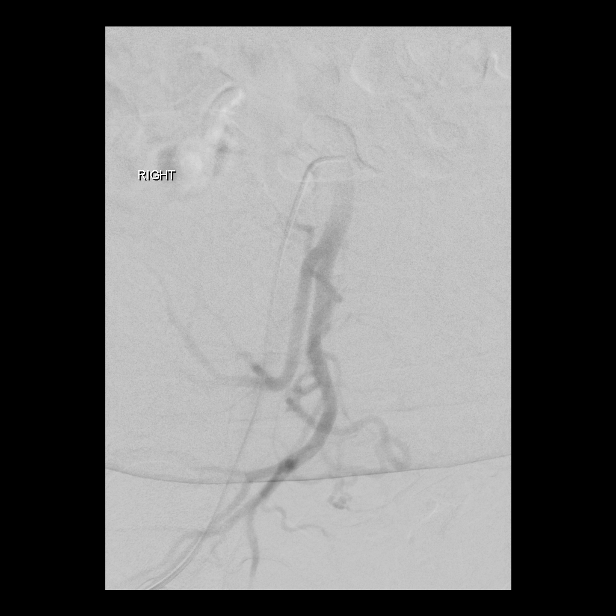

[Series 11: care body 4 · 1 of 25 frames shown (7 of 13)]
[frame 13/25]
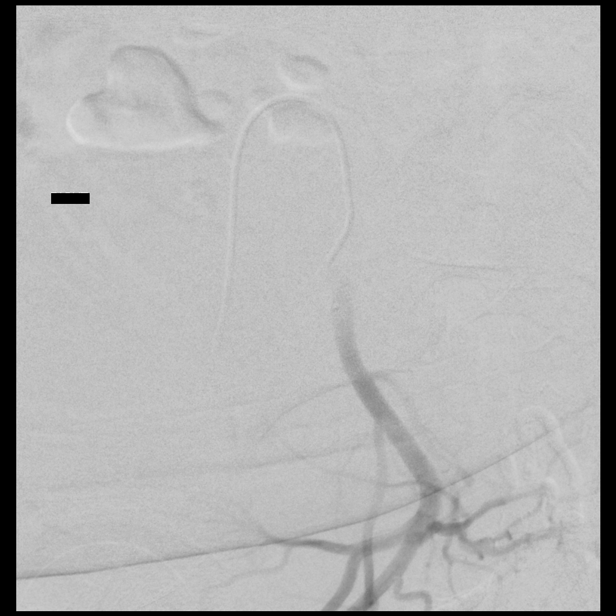

[Series 12: care body 4 · 1 of 25 frames shown (8 of 13)]
[frame 13/25]
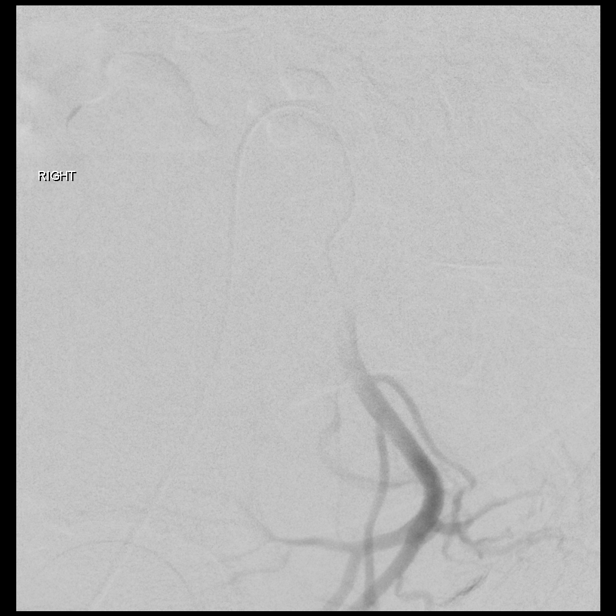

[Series 14: care body 4 · 1 of 25 frames shown (9 of 13)]
[frame 4/25]
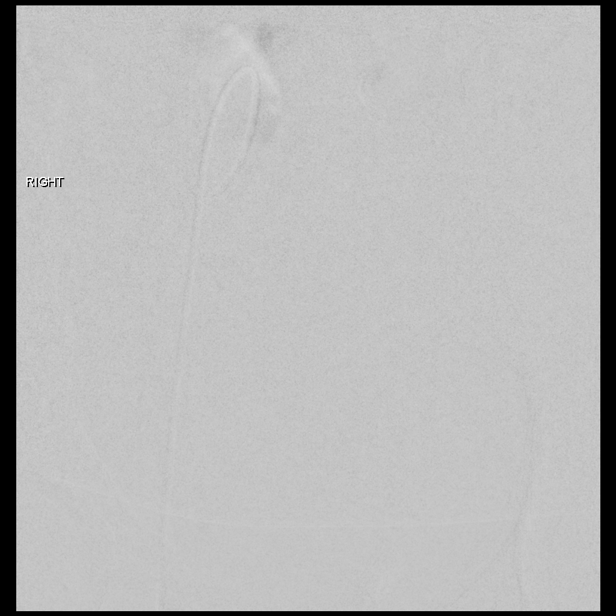

[Series 15: care body 4 · 1 of 21 frames shown (10 of 13)]
[frame 18/21]
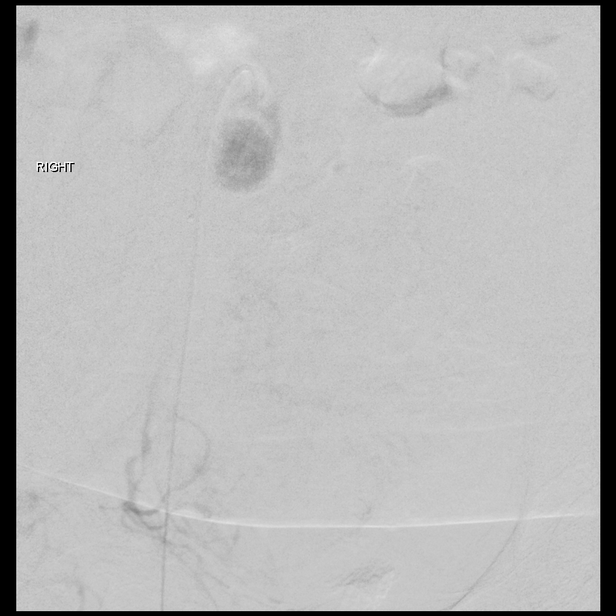

[Series 17: care body 4 · 1 of 32 frames shown (11 of 13)]
[frame 17/32]
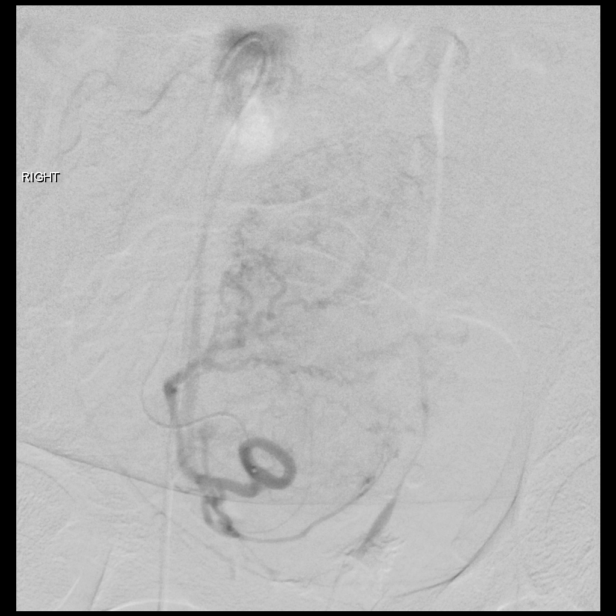

[Series 19: care body 4 · 1 of 22 frames shown (12 of 13)]
[frame 1/22]
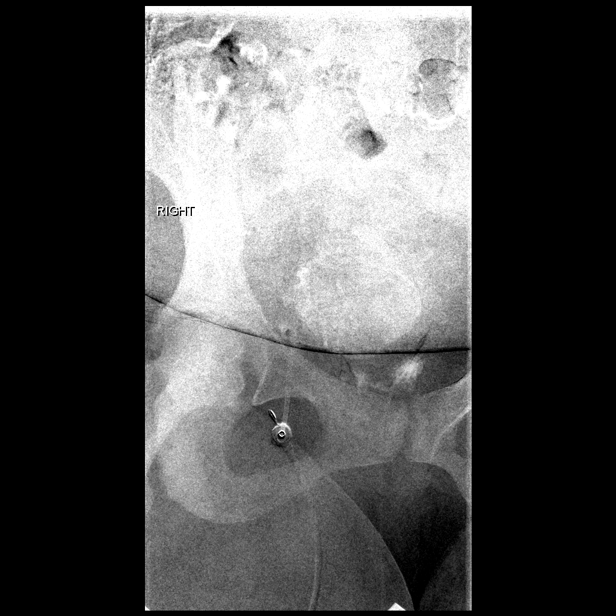

[Series 20: care body 4 · 1 of 21 frames shown (13 of 13)]
[frame 18/21]
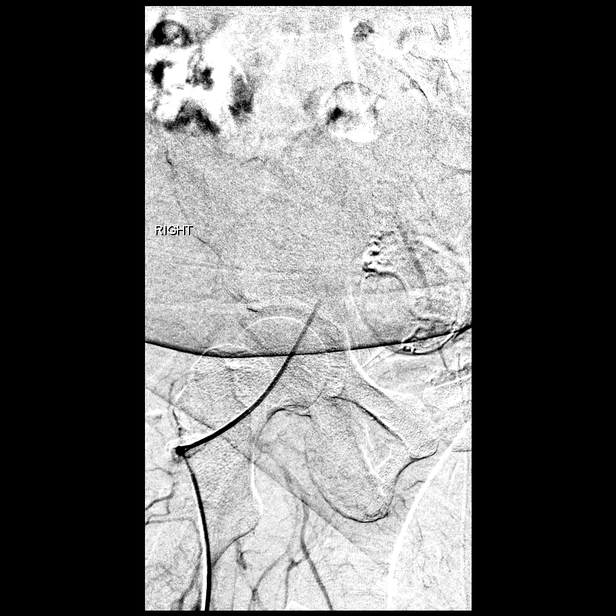

[13 of 24 positions shown; findings below may reference images not displayed]

EXAM:
UTERINE FIBROID EMBOLIZATION

Radiologist:  Rosenberg, Yk

Guidance:  Ultrasound and fluoroscopic

MEDICATIONS:
1.5 g vancomycin. The antibiotic was administered within 1 hour of
the procedure

ANESTHESIA/SEDATION:
Fentanyl 200 mcg IV; Versed 10 mg IV

Moderate Sedation Time:  100 minutes

The patient was continuously monitored during the procedure by the
interventional radiology nurse under my direct supervision.

CONTRAST:  115 cc Isovue 300

FLUOROSCOPY TIME:  Fluoroscopy Time: 38 minutes 24 seconds (5,969
mGy).

COMPLICATIONS:
None immediate.

PROCEDURE:
Informed consent was obtained from the patient following explanation
of the procedure, risks, benefits and alternatives. The patient
understands, agrees and consents for the procedure. All questions
were addressed. A time out was performed.

Maximal barrier sterile technique utilized including caps, mask,
sterile gowns, sterile gloves, large sterile drape, hand hygiene,
and betadine prep.

Under sterile conditions and local anesthesia, rightcommon femoral
artery access was performed with a micropuncture needle. Ultrasound
was utilized for access. Images were obtained for documentation. A
guide wire was advanced followed by a 5-French sheath. A 5-French C2
catheter was utilized to select the contralateral left internal
iliac artery. Selective left internal iliac angiogram was performed.
The tortuous left uterine artery was identified. Selective
catheterization was performed of the left uterine artery with a J
tip Direxion microcatheter and Fathom micro guide wire. A selective
left uterine angiogram was performed. This demonstrated patency of
the left uterine artery. Mild diffuse hypervascularity of the
enlarged fibroid uterus. Left uterine artery is the nondominant
supply. Access was adequate for embolization. For embolization, 2
vials of 500 - 700 micron Embospheres were injected into the left
uterine artery. Post embolization angiogram confirms complete stasis
of the left uterine vascular territory. Microcatheter was removed.

The C2 catheter was retracted and utilized to select the right
internal iliac artery. Selective right internal iliac angiogram was
performed. The patent right uterine artery was identified. For
selective catheterization, the Renegade high flow micro catheter and
fathom micro guidewire were utilized to select the right uterine
artery. Selective right uterine angiogram was performed. This
demonstrated patency of the right uterine artery. Right uterine
artery is the dominant supply to the fibroids. Catheter position was
safe for embolization. Embolization was performed to complete stasis
with injection of 2 vials of 500-700 micron Embospheres and 1 5of
700-900 micron Embospheres. Post embolization angiogram confirms
complete stasis of the right uterine vascular territory.

Microcatheter and C2 catheter were removed.

Injection of the rightcommon femoral artery sheath confirms access
is adequate for Exoseal. Hemostasis was obtained with a 6-French
Exoseal. No immediate complication. Peripheral pedal pulses are
normal +2.

The patient tolerated the procedure well. No immediate complication.
IMPRESSION: Successful bilateral uterine artery embolization (U F E)

## 2017-11-01 ENCOUNTER — Emergency Department (HOSPITAL_BASED_OUTPATIENT_CLINIC_OR_DEPARTMENT_OTHER)
Admission: EM | Admit: 2017-11-01 | Discharge: 2017-11-02 | Disposition: A | Discharge status: Home / Self Care | Payer: Medicare HMO | Attending: Emergency Medicine | Admitting: Emergency Medicine

## 2017-11-01 ENCOUNTER — Other Ambulatory Visit: Payer: Self-pay

## 2017-11-01 ENCOUNTER — Encounter (HOSPITAL_BASED_OUTPATIENT_CLINIC_OR_DEPARTMENT_OTHER): Payer: Self-pay | Admitting: *Deleted

## 2017-11-01 DIAGNOSIS — I10 Essential (primary) hypertension: Secondary | ICD-10-CM | POA: Diagnosis not present

## 2017-11-01 DIAGNOSIS — Z7984 Long term (current) use of oral hypoglycemic drugs: Secondary | ICD-10-CM | POA: Insufficient documentation

## 2017-11-01 DIAGNOSIS — Z87891 Personal history of nicotine dependence: Secondary | ICD-10-CM | POA: Insufficient documentation

## 2017-11-01 DIAGNOSIS — M62838 Other muscle spasm: Secondary | ICD-10-CM | POA: Insufficient documentation

## 2017-11-01 DIAGNOSIS — Z79899 Other long term (current) drug therapy: Secondary | ICD-10-CM | POA: Insufficient documentation

## 2017-11-01 DIAGNOSIS — E039 Hypothyroidism, unspecified: Secondary | ICD-10-CM | POA: Diagnosis not present

## 2017-11-01 DIAGNOSIS — E119 Type 2 diabetes mellitus without complications: Secondary | ICD-10-CM | POA: Diagnosis not present

## 2017-11-01 DIAGNOSIS — M25511 Pain in right shoulder: Secondary | ICD-10-CM | POA: Diagnosis present

## 2017-11-01 LAB — BASIC METABOLIC PANEL
ANION GAP: 11 (ref 5–15)
BUN: 11 mg/dL (ref 6–20)
CALCIUM: 9 mg/dL (ref 8.9–10.3)
CHLORIDE: 99 mmol/L (ref 98–111)
CO2: 29 mmol/L (ref 22–32)
Creatinine, Ser: 0.64 mg/dL (ref 0.44–1.00)
GFR calc non Af Amer: >60 mL/min (ref >60)
GLUCOSE: 135 mg/dL — AB (ref 70–99)
Potassium: 3.5 mmol/L (ref 3.5–5.1)
Sodium: 139 mmol/L (ref 135–145)

## 2017-11-01 MED ORDER — KETOROLAC TROMETHAMINE 60 MG/2ML IM SOLN
30.0000 mg | Freq: Once | INTRAMUSCULAR | Status: AC
  Administered 2017-11-02: 30 mg via INTRAMUSCULAR
  Filled 2017-11-01: qty 2

## 2017-11-01 NOTE — ED Provider Notes (Signed)
[facility] EMERGENCY DEPARTMENT Provider Note  CSN: [id number] Arrival date & time: 11/01/17 2041  Chief Complaint(s) right shoulder and neck pain  HPI Anita Rodgers is a 47 y.o. female with past medical history listed below who presents to the emergency department with 3 weeks of right upper back, neck and shoulder pain described as aching and cramping.  Pain is fluctuating in nature.  Exacerbated with movement and palpation of this area.  Patient has been seen multiple times by her PCP and urgent care and diagnosed with trapezius muscle spasm.  She is been given Dilaudid pills, muscle relaxers and Motrin.  She states that she has found minimal relief with this.  Reports that he exacerbates it as well.  Ice does seem to provide some relief.  She denies any associated trauma.  Denies any chest pain or shortness of breath.  No focal weakness.  She denies any other physical complaints at this time.  HPI   Past Medical History Past Medical History:  Diagnosis Date  . Anxiety   . Arthritis    knees  . Complication of anesthesia    pt states she has malignant hyperthermia  . Depression   . Diabetes mellitus   . Equinus deformity of foot    Bilateral  . Family history of anesthesia complication    malignant hyperthermia  . Fibroids   . GERD (gastroesophageal reflux disease)   . Headache(784.0)   . Hypertension   . Hypothyroidism   . Malignant hyperthemia 2007, 2011   x 2 episodes  . Seasonal allergies   . Sleep apnea    on CPAP  . SVD (spontaneous vaginal delivery)    x 1   Patient Active Problem List   Diagnosis Date Noted  . Fibroid uterus 10/03/2015  . Pronation deformity of ankle, acquired 10/26/2013  . Metatarsalgia of left foot 10/26/2013  . DM2 (diabetes mellitus, type 2) ([place]) 01/19/2013  . Leg edema 01/19/2013  . Chest pain 01/19/2013  . DOE (dyspnea on exertion) 01/19/2013  . HTN (hypertension) 01/19/2013  . Dyslipidemia 01/19/2013  . Morbid obesity  ([place]) 01/19/2013  . OSA on CPAP 01/19/2013  . Excessive or frequent menstruation 09/05/2011   Home Medication(s) Prior to Admission medications   Medication Sig Start Date End Date Taking? Authorizing Provider  amLODipine (NORVASC) 5 MG tablet Take 5 mg by mouth daily.   Yes [provider]  buPROPion HCl (WELLBUTRIN PO) Take by mouth.   Yes [provider]  buPROPion HCl (WELLBUTRIN XL PO) Take by mouth.   Yes [provider]  HYDROMORPHONE HCL ER PO Take 12 mg by mouth.    Yes [provider]  HYDROMORPHONE HCL ER PO Take 12 mg by mouth.   Yes [provider]  HYDROXYZINE HCL PO Take by mouth.   Yes [provider]  losartan (COZAAR) 100 MG tablet Take 100 mg by mouth daily.   Yes [provider]  modafinil (PROVIGIL) 200 MG tablet Take 200 mg by mouth daily.   Yes [provider]  acetaminophen (TYLENOL) 500 MG tablet Take 1,000 mg by mouth 2 (two) times daily as needed. For pain      [provider]  ALPRAZolam (XANAX) 0.5 MG tablet Take 0.5 mg by mouth every 12 (twelve) hours as needed. For anxiety     [provider]  amitriptyline (ELAVIL) 25 MG tablet Take 25 mg by mouth at bedtime.     [provider]  cyclobenzaprine (FLEXERIL)  5 MG tablet Take 1-2 tablets (5-10 mg total) by mouth at bedtime for 15 days. 11/02/17 11/17/17  Fatima Blank, MD  doxycycline (VIBRA-TABS) 100 MG tablet Take 100 mg by mouth daily. Reported on 07/24/2015    [provider]  Escitalopram Oxalate (LEXAPRO PO) Take 10 mg by mouth daily.     [provider]  EXFORGE 10-320 MG per tablet TAKE 1 TABLET BY MOUTH EVERY DAY**NEEDS APPOINTMENT FOR FURTHER REFILLS** 09/27/14   Hilty, Nadean Corwin, MD  fluticasone (FLONASE) 50 MCG/ACT nasal spray Place 1 spray into the nose daily.      [provider]  furosemide (LASIX) 40 MG tablet Take 40 mg by mouth 2 (two) times daily.    [provider]  HYDROmorphone (DILAUDID) 4 MG tablet Take 4 mg by mouth every 6 (six) hours as needed for pain.    [provider]  HYDROmorphone HCl (EXALGO) 8 MG T24A SR tablet Take 24 mg by mouth daily.    [provider]  levothyroxine (SYNTHROID, LEVOTHROID) 50 MCG tablet Take 50 mcg by mouth daily.    [provider]  loratadine (CLARITIN) 10 MG tablet Take 10 mg by mouth daily.      [provider]  Magnesium 250 MG TABS Take 1 tablet by mouth daily.    [provider]  metFORMIN (GLUCOPHAGE-XR) 500 MG 24 hr tablet Take 1,000 mg by mouth 2 (two) times daily.     [provider]  montelukast (SINGULAIR) 10 MG tablet Take 10 mg by mouth at bedtime.      [provider]  Multiple Vitamin (MULITIVITAMIN WITH MINERALS) TABS Take 1 tablet by mouth daily.    [provider]  Nebivolol HCl (BYSTOLIC) 20 MG TABS Take 1 tablet by mouth at bedtime.     [provider]  NON FORMULARY Take 4 tablets by mouth daily. Omega XL    [provider]  NON FORMULARY at bedtime. CPAP    [provider]  Polyethyl Glycol-Propyl Glycol (SYSTANE ULTRA) 0.4-0.3 % SOLN Place 1 drop into both eyes daily as needed. For dry eyes    [provider]  potassium chloride SA (K-DUR,KLOR-CON) 20 MEQ tablet Take 20 mEq by mouth daily.    [provider]  progesterone (PROMETRIUM) 200 MG capsule Take 200 mg by mouth at bedtime.     [provider]  spironolactone (ALDACTONE) 50 MG tablet Take 50 mg by mouth daily.    [provider]  SUMAtriptan (IMITREX) 50 MG tablet Take 50 mg by mouth once as needed. For migraine     [provider]                                                                                                                                    Past Surgical History Past Surgical History:  Procedure Laterality Date  . IR GENERIC HISTORICAL  07/24/2015   IR  RADIOLOGIST EVAL & MGMT 07/24/2015 Greggory Keen, MD [facility] INTERV RAD  . IR GENERIC HISTORICAL  11/06/2015   IR RADIOLOGIST EVAL & MGMT 11/06/2015 Greggory Keen, MD [facility] INTERV RAD  . KNEE SURGERY     x 2 left   Family History Family History  Problem Relation Age of Onset  . Hypertension Mother   . Hyperlipidemia Mother   . Depression Mother   . Hypertension Father   . Diabetes Father   . Hypertension Maternal Grandmother   . Diabetes Maternal Grandmother   . Stroke Maternal Grandmother   . Heart disease Maternal Grandmother   . Prostate cancer Maternal Grandfather   . Uterine cancer Paternal Grandmother   . Cervical cancer Paternal Grandmother   . Hypertension Sister     Social History Social History   Tobacco Use  . Smoking status: Former Smoker    Packs/day: 0.50    Years: 3.00    Pack years: 1.50    Types: Cigarettes    Last attempt to quit: 03/24/1998    Years since quitting: 19.6  . Smokeless tobacco: Never Used  Substance Use Topics  . Alcohol use: No  . Drug use: No   Allergies Penicillins  Review of Systems Review of Systems All other systems are reviewed and are negative for acute change except as noted in the HPI  Physical Exam Vital Signs  I have reviewed the triage vital signs BP (!) 152/85 (BP Location: Left Arm)   Pulse 98   Temp 98.4 F (36.9 C) (Oral)   Resp 16   Ht 5\' 6"  (1.676 m)   Wt 136.1 kg   LMP 10/11/2017 (Approximate)   SpO2 94%   BMI 48.42 kg/m   Physical Exam  Constitutional: She is oriented to person, place, and time. She appears well-developed and well-nourished. No distress.  HENT:  Head: Normocephalic and atraumatic.  Right Ear: External ear normal.  Left Ear: External ear normal.  Nose: Nose normal.  Eyes: Conjunctivae and EOM are normal. No scleral icterus.  Neck: Normal range of motion and phonation normal. Muscular tenderness present. No spinous process tenderness present.    Cardiovascular: Normal rate and  regular rhythm.  Pulmonary/Chest: Effort normal. No stridor. No respiratory distress.  Abdominal: She exhibits no distension.  Musculoskeletal: Normal range of motion. She exhibits no edema.       Cervical back: She exhibits tenderness and spasm.       Back:  Neurological: She is alert and oriented to person, place, and time.  Skin: She is not diaphoretic.  Psychiatric: She has a normal mood and affect. Her behavior is normal.  Vitals reviewed.   ED Results and Treatments Labs (all labs ordered are listed, but only abnormal results are displayed) Labs Reviewed  BASIC METABOLIC PANEL - Abnormal; Notable for the following components:      Result Value   Glucose, Bld 135 (*)    All other components within normal limits  EKG  EKG Interpretation  Date/Time:    Ventricular Rate:    PR Interval:    QRS Duration:   QT Interval:    QTC Calculation:   R Axis:     Text Interpretation:        Radiology No results found. Pertinent labs & imaging results that were available during my care of the patient were reviewed by me and considered in my medical decision making (see chart for details).  Medications Ordered in ED Medications  ketorolac (TORADOL) injection 30 mg (30 mg Intramuscular Given 11/02/17 0006)                                                                                                                                    Procedures Procedures  (including critical care time)  Medical Decision Making / ED Course I have reviewed the nursing notes for this encounter and the patient's prior records (if available in EHR or on provided paperwork).    47 y.o. female presents with right shoulder girdle muscle pain. Suspect muscle strain/spasm. No indication for imaging emergently.  Given the fact the patient is on furosemide, BMP was obtained but did not  reveal any potassium or calcium derangements.  Renal function is intact.  Patient was recommended to take short course of scheduled NSAIDs and engage in early mobility as definitive treatment. Return precautions discussed for worsening or new concerning symptoms.   The patient is safe for discharge with strict return precautions.  Final Clinical Impression(s) / ED Diagnoses Final diagnoses:  Muscle spasm of right shoulder    Disposition: Discharge  Condition: Good  I have discussed the results, Dx and Tx plan with the patient who expressed understanding and agree(s) with the plan. Discharge instructions discussed at great length. The patient was given strict return precautions who verbalized understanding of the instructions. No further questions at time of discharge.    ED Discharge Orders         Ordered    cyclobenzaprine (FLEXERIL) 5 MG tablet  Daily at bedtime     11/02/17 0026           Follow Up: Glendon Axe, MD [address] [place] [address] [phone]  Schedule an appointment as soon as possible for a visit  in 1-2 weeks, If symptoms do not improve or  worsen     This chart was dictated using voice recognition software.  Despite best efforts to proofread,  errors can occur which can change the documentation meaning.   Fatima Blank, MD 11/02/17 [phone]

## 2017-11-01 NOTE — ED Triage Notes (Addendum)
C/o right shoulder and neck pain that started 3 weeks ago and states has not improved. States pain radiates into her back. States pain worse when moving head in any direction. Denies any injury. States no medications she has tried has helped. Has taken muscle relaxer without relief. Has been seen by her primary md twice and urgent care once.

## 2017-11-01 NOTE — Discharge Instructions (Addendum)
You may use over-the-counter Motrin (Ibuprofen), Acetaminophen (Tylenol), topical muscle creams such as SalonPas, [employer], Bengay, etc. Please stretch, apply heat or ice, and have massage therapy for additional assistance.

## 2017-11-02 MED ORDER — CYCLOBENZAPRINE HCL 5 MG PO TABS: 5.0000 mg | ORAL_TABLET | Freq: Every day | ORAL | 0 refills | Status: AC

## 2017-11-04 ENCOUNTER — Emergency Department (HOSPITAL_BASED_OUTPATIENT_CLINIC_OR_DEPARTMENT_OTHER)
Admission: EM | Admit: 2017-11-04 | Discharge: 2017-11-04 | Disposition: A | Discharge status: Home / Self Care | Payer: Medicare HMO | Attending: Emergency Medicine | Admitting: Emergency Medicine

## 2017-11-04 ENCOUNTER — Emergency Department (HOSPITAL_BASED_OUTPATIENT_CLINIC_OR_DEPARTMENT_OTHER): Payer: Medicare HMO

## 2017-11-04 ENCOUNTER — Encounter (HOSPITAL_BASED_OUTPATIENT_CLINIC_OR_DEPARTMENT_OTHER): Payer: Self-pay | Admitting: Emergency Medicine

## 2017-11-04 ENCOUNTER — Other Ambulatory Visit: Payer: Self-pay

## 2017-11-04 DIAGNOSIS — E039 Hypothyroidism, unspecified: Secondary | ICD-10-CM | POA: Diagnosis not present

## 2017-11-04 DIAGNOSIS — E119 Type 2 diabetes mellitus without complications: Secondary | ICD-10-CM | POA: Insufficient documentation

## 2017-11-04 DIAGNOSIS — M5412 Radiculopathy, cervical region: Secondary | ICD-10-CM | POA: Diagnosis not present

## 2017-11-04 DIAGNOSIS — I10 Essential (primary) hypertension: Secondary | ICD-10-CM | POA: Diagnosis not present

## 2017-11-04 DIAGNOSIS — M542 Cervicalgia: Secondary | ICD-10-CM | POA: Diagnosis present

## 2017-11-04 DIAGNOSIS — Z79899 Other long term (current) drug therapy: Secondary | ICD-10-CM | POA: Insufficient documentation

## 2017-11-04 DIAGNOSIS — Z87891 Personal history of nicotine dependence: Secondary | ICD-10-CM | POA: Insufficient documentation

## 2017-11-04 DIAGNOSIS — R2242 Localized swelling, mass and lump, left lower limb: Secondary | ICD-10-CM | POA: Insufficient documentation

## 2017-11-04 DIAGNOSIS — Z7984 Long term (current) use of oral hypoglycemic drugs: Secondary | ICD-10-CM | POA: Insufficient documentation

## 2017-11-04 DIAGNOSIS — M79662 Pain in left lower leg: Secondary | ICD-10-CM | POA: Insufficient documentation

## 2017-11-04 MED ORDER — NALOXONE HCL 4 MG/0.1ML NA LIQD: 1.0000 | Freq: Once | NASAL | 1 refills | Status: AC

## 2017-11-04 MED ORDER — DOXYCYCLINE HYCLATE 100 MG PO CAPS: ORAL_CAPSULE | ORAL | 0 refills | Status: AC

## 2017-11-04 MED ORDER — IBUPROFEN 400 MG PO TABS
400.0000 mg | ORAL_TABLET | Freq: Once | ORAL | Status: AC
  Administered 2017-11-04: 400 mg via ORAL
  Filled 2017-11-04: qty 1

## 2017-11-04 MED ORDER — HYDROCODONE-ACETAMINOPHEN 5-325 MG PO TABS
1.0000 | ORAL_TABLET | Freq: Once | ORAL | Status: AC
  Administered 2017-11-04: 1 via ORAL
  Filled 2017-11-04: qty 1

## 2017-11-04 NOTE — ED Provider Notes (Signed)
  Provider Note MRN:  [id number]  Arrival date & time: 11/04/17    ED Course and Medical Decision Making  I received sign out for this patient at shift change from Dr. Christy Gentles.  DVT study unremarkable.  Patient will follow with her pain management doctor for further pain prescriptions.  After the discussed management above, the patient was determined to be safe for discharge.  The patient was in agreement with this plan and all questions regarding their care were answered.  ED return precautions were discussed and the patient will return to the ED with any significant worsening of condition.  Barth Kirks. Sedonia Small, [place] [email or web address]    Maudie Flakes, MD 11/04/17 [phone]

## 2017-11-04 NOTE — ED Provider Notes (Signed)
[facility] EMERGENCY DEPARTMENT Provider Note   CSN: [id number] Arrival date & time: 11/04/17  0540     History   Chief Complaint Chief Complaint  Patient presents with  . multiple complaints    HPI Anita Rodgers is a 47 y.o. female.  The history is provided by the patient and a parent.  Patient reports ongoing right-sided neck pain for over 2 weeks.  Denies trauma.  Reports the pain started first in her neck, radiates into her upper back as well as her right shoulder.  She reports some numbness in her right arm.  Denies any chest pain or shortness of breath.  No headache, no visual changes.  No fecal/urinary incontinence reported.  No new leg weakness reported.  No recent imaging of her neck.  She has been seen multiple times for this including a recent ER visit.  She is on multiple pain medications as well as muscle relaxants. Her course is worsening, palpation and movement of head worsen her pain.  Rest improves her symptoms  Past Medical History:  Diagnosis Date  . Anxiety   . Arthritis    knees  . Complication of anesthesia    pt states she has malignant hyperthermia  . Depression   . Diabetes mellitus   . Equinus deformity of foot    Bilateral  . Family history of anesthesia complication    malignant hyperthermia  . Fibroids   . GERD (gastroesophageal reflux disease)   . Headache(784.0)   . Hypertension   . Hypothyroidism   . Malignant hyperthemia 2007, 2011   x 2 episodes  . Seasonal allergies   . Sleep apnea    on CPAP  . SVD (spontaneous vaginal delivery)    x 1    Patient Active Problem List   Diagnosis Date Noted  . Fibroid uterus 10/03/2015  . Pronation deformity of ankle, acquired 10/26/2013  . Metatarsalgia of left foot 10/26/2013  . DM2 (diabetes mellitus, type 2) ([place]) 01/19/2013  . Leg edema 01/19/2013  . Chest pain 01/19/2013  . DOE (dyspnea on exertion) 01/19/2013  . HTN (hypertension) 01/19/2013  . Dyslipidemia 01/19/2013  .  Morbid obesity ([place]) 01/19/2013  . OSA on CPAP 01/19/2013  . Excessive or frequent menstruation 09/05/2011    Past Surgical History:  Procedure Laterality Date  . IR GENERIC HISTORICAL  07/24/2015   IR RADIOLOGIST EVAL & MGMT 07/24/2015 Greggory Keen, MD [facility] INTERV RAD  . IR GENERIC HISTORICAL  11/06/2015   IR RADIOLOGIST EVAL & MGMT 11/06/2015 Greggory Keen, MD [facility] INTERV RAD  . KNEE SURGERY     x 2 left     OB History   None      Home Medications    Prior to Admission medications   Medication Sig Start Date End Date Taking? Authorizing Provider  acetaminophen (TYLENOL) 500 MG tablet Take 1,000 mg by mouth 2 (two) times daily as needed. For pain      [provider]  ALPRAZolam (XANAX) 0.5 MG tablet Take 0.5 mg by mouth every 12 (twelve) hours as needed. For anxiety     [provider]  amitriptyline (ELAVIL) 25 MG tablet Take 25 mg by mouth at bedtime.     [provider]  amLODipine (NORVASC) 5 MG tablet Take 5 mg by mouth daily.    [provider]  buPROPion HCl (WELLBUTRIN PO) Take by mouth.    [provider]  buPROPion HCl (WELLBUTRIN XL PO) Take by mouth.  [provider]  cyclobenzaprine (FLEXERIL) 5 MG tablet Take 1-2 tablets (5-10 mg total) by mouth at bedtime for 15 days. 11/02/17 11/17/17  Fatima Blank, MD  doxycycline (VIBRA-TABS) 100 MG tablet Take 100 mg by mouth daily. Reported on 07/24/2015    [provider]  Escitalopram Oxalate (LEXAPRO PO) Take 10 mg by mouth daily.     [provider]  EXFORGE 10-320 MG per tablet TAKE 1 TABLET BY MOUTH EVERY DAY**NEEDS APPOINTMENT FOR FURTHER REFILLS** 09/27/14   Hilty, Nadean Corwin, MD  fluticasone (FLONASE) 50 MCG/ACT nasal spray Place 1 spray into the nose daily.      [provider]  furosemide (LASIX) 40 MG tablet Take 40 mg by mouth 2 (two) times daily.    [provider]  HYDROmorphone (DILAUDID) 4 MG tablet Take 4 mg by  mouth every 6 (six) hours as needed for pain.    [provider]  HYDROmorphone HCl (EXALGO) 8 MG T24A SR tablet Take 24 mg by mouth daily.    [provider]  HYDROMORPHONE HCL ER PO Take 12 mg by mouth.     [provider]  HYDROMORPHONE HCL ER PO Take 12 mg by mouth.    [provider]  HYDROXYZINE HCL PO Take by mouth.    [provider]  levothyroxine (SYNTHROID, LEVOTHROID) 50 MCG tablet Take 50 mcg by mouth daily.    [provider]  loratadine (CLARITIN) 10 MG tablet Take 10 mg by mouth daily.      [provider]  losartan (COZAAR) 100 MG tablet Take 100 mg by mouth daily.    [provider]  Magnesium 250 MG TABS Take 1 tablet by mouth daily.    [provider]  metFORMIN (GLUCOPHAGE-XR) 500 MG 24 hr tablet Take 1,000 mg by mouth 2 (two) times daily.     [provider]  modafinil (PROVIGIL) 200 MG tablet Take 200 mg by mouth daily.    [provider]  montelukast (SINGULAIR) 10 MG tablet Take 10 mg by mouth at bedtime.      [provider]  Multiple Vitamin (MULITIVITAMIN WITH MINERALS) TABS Take 1 tablet by mouth daily.    [provider]  Nebivolol HCl (BYSTOLIC) 20 MG TABS Take 1 tablet by mouth at bedtime.     [provider]  NON FORMULARY Take 4 tablets by mouth daily. Omega XL    [provider]  NON FORMULARY at bedtime. CPAP    [provider]  Polyethyl Glycol-Propyl Glycol (SYSTANE ULTRA) 0.4-0.3 % SOLN Place 1 drop into both eyes daily as needed. For dry eyes    [provider]  potassium chloride SA (K-DUR,KLOR-CON) 20 MEQ tablet Take 20 mEq by mouth daily.    [provider]  progesterone (PROMETRIUM) 200 MG capsule Take 200 mg by mouth at bedtime.     [provider]  spironolactone (ALDACTONE) 50 MG tablet Take 50 mg by mouth daily.    [provider]  SUMAtriptan (IMITREX) 50 MG tablet Take  50 mg by mouth once as needed. For migraine     [provider]    Family History Family History  Problem Relation Age of Onset  . Hypertension Mother   . Hyperlipidemia Mother   . Depression Mother   . Hypertension Father   . Diabetes Father   . Hypertension Maternal Grandmother   . Diabetes Maternal Grandmother   . Stroke Maternal Grandmother   .  Heart disease Maternal Grandmother   . Prostate cancer Maternal Grandfather   . Uterine cancer Paternal Grandmother   . Cervical cancer Paternal Grandmother   . Hypertension Sister     Social History Social History   Tobacco Use  . Smoking status: Former Smoker    Packs/day: 0.50    Years: 3.00    Pack years: 1.50    Types: Cigarettes    Last attempt to quit: 03/24/1998    Years since quitting: 19.6  . Smokeless tobacco: Never Used  Substance Use Topics  . Alcohol use: No  . Drug use: No     Allergies   Penicillins   Review of Systems Review of Systems  Constitutional: Negative for fever.  Eyes: Negative for visual disturbance.  Gastrointestinal: Negative for vomiting.  Musculoskeletal: Positive for arthralgias and neck pain.  Neurological: Positive for numbness. Negative for headaches.  All other systems reviewed and are negative.    Physical Exam Updated Vital Signs BP (!) 187/105 (BP Location: Left Arm)   Pulse (!) 118   Temp 98.8 F (37.1 C) (Oral)   Resp (!) 22   Ht 1.676 m (5\' 6" )   Wt 136.1 kg   LMP 10/11/2017 (Approximate)   SpO2 95%   BMI 48.42 kg/m   Physical Exam  CONSTITUTIONAL: Well developed/well nourished, anxious HEAD: Normocephalic/atraumatic EYES: EOMI/PERRL, no ptosis ENMT: Mucous membranes moist, normal phonation, no stridor NECK: supple no meningeal signs, no unilateral neck edema, no induration, no erythema, no lymphadenopathy, no thrill noted SPINE/BACK:entire spine nontender, right-sided cervical paraspinal tenderness, no erythema, no crepitus CV: S1/S2 noted, no  murmurs/rubs/gallops noted, mildly tachycardic LUNGS: Lungs are clear to auscultation bilaterally, no apparent distress ABDOMEN: Obese ,soft, nontender, no rebound or guarding, bowel sounds noted throughout abdomen GU:no cva tenderness NEURO: Pt is awake/alert/appropriate, moves all extremitiesx4.  No facial droop.   Equal power (5/5) with hand grip, wrist flex/extension, elbow flex/extension, and equal power with shoulder abduction/adduction.  Reports numbness throughout right arm Equal biceps/brachioradialis reflex in bilateral UE EXTREMITIES: pulses normal/equal, full ROM, tenderness noted to right trapezius but no erythema, no excessive edema, no deformities, she can range her right shoulder without difficulty SKIN: warm, color normal PSYCH: anxious  ED Treatments / Results  Labs (all labs ordered are listed, but only abnormal results are displayed) Labs Reviewed - No data to display  EKG None  Radiology Dg Chest 2 View  Result Date: 11/04/2017 CLINICAL DATA:  Right shoulder pain. EXAM: CHEST - 2 VIEW COMPARISON:  Radiographs of April 18, 2010. FINDINGS: The heart size and mediastinal contours are within normal limits. Both lungs are clear. No pneumothorax or pleural effusion is noted. The visualized skeletal structures are unremarkable. IMPRESSION: No active cardiopulmonary disease. Electronically Signed   By: Marijo Conception, M.D.   On: 11/04/2017 07:24   Dg Cervical Spine Complete  Result Date: 11/04/2017 CLINICAL DATA:  Right shoulder and neck pain for 4 weeks without known injury. EXAM: CERVICAL SPINE - COMPLETE 4+ VIEW COMPARISON:  None. FINDINGS: There is no evidence of cervical spine fracture or prevertebral soft tissue swelling. Alignment is normal. No other significant bone abnormalities are identified. IMPRESSION: Negative cervical spine radiographs. Electronically Signed   By: Marijo Conception, M.D.   On: 11/04/2017 07:27    Procedures Procedures  Medications Ordered  in ED Medications  HYDROcodone-acetaminophen (NORCO/VICODIN) 5-325 MG per tablet 1 tablet (1 tablet Oral Given 11/04/17 0701)  ibuprofen (ADVIL,MOTRIN) tablet 400 mg (400 mg Oral Given 11/04/17  0701)     Initial Impression / Assessment and Plan / ED Course  I have reviewed the triage vital signs and the nursing notes.  Narcotic database reviewed and considered in decision making Pertinent imaging results that were available during my care of the patient were reviewed by me and considered in my medical decision making (see chart for details).     6:42 AM Patient presents for ongoing pain in her right shoulder and neck is been present for 3 weeks.  She is been told some muscle spasm, and was recently seen in this emergency department for similar symptoms.  She denies having any imaging of her neck.  She does report pain and numbness into her right arm.  This could represent cervical radiculopathy We will obtain plain imaging. Patient is on multiple narcotics as well as muscle relaxants.  She admits that she feels muscle relaxants over sedate her, and she has not had anything for pain this morning 7:34 AM X-ray imaging is negative.  For this I feel the patient can follow-up as an outpatient and may need MRI as I am strongly suspicious for cervical radiculopathy.  No signs of any acute neurologic emergency at this time therefore this can be managed as an outpatient. Agree with this plan.  She will cut back on muscle relaxants, but already has other pain medicines at home.  Due to her heavy narcotic use, I have sent Narcan to her pharmacy to use in case of emergency at home. patient and her mother agreeable with this  On repeat exam, pt reports worsened left LE pain, swelling and redness   Distal pulses intact, left calf tenderness with localized erythema, crepitus, no abscess It does appear larger than right leg.  This could represent DVT Plan to obtain DVT ultrasound If negative can be  discharged home with treatment for cellulitis D/w dr Sedonia Small at signout  Final Clinical Impressions(s) / ED Diagnoses   Final diagnoses:  Cervical radiculopathy    ED Discharge Orders         Ordered    naloxone [facility]) nasal spray 4 mg/0.1 mL   Once     11/04/17 0720    doxycycline (VIBRAMYCIN) 100 MG capsule     11/04/17 0730           Ripley Fraise, MD 11/04/17 [phone]

## 2017-11-04 NOTE — Discharge Instructions (Addendum)
You were evaluated in the Emergency Department and after careful evaluation, we did not find any emergent condition requiring admission or further testing in the hospital. ° °Please return to the Emergency Department if you experience any worsening of your condition.  We encourage you to follow up with a primary care provider.  Thank you for allowing us to be a part of your care. °

## 2017-11-04 NOTE — ED Triage Notes (Signed)
C/o right shoulder and neck pain that started 3 weeks ago and states has not improved. States pain radiates into her back. States pain worse when moving head in any direction. Denies any injury. States no medications she has tried has helped. Has taken muscle relaxer without relief. Has been seen by her primary md twice and urgent care once.  pt states she was seen here few days ago for the same complaint and sent home pain is getting worse.

## 2017-11-04 NOTE — ED Notes (Signed)
ED Provider at bedside. 

## 2020-04-10 ENCOUNTER — Institutional Professional Consult (permissible substitution): Payer: Medicare HMO | Admitting: Plastic Surgery

## 2021-11-05 ENCOUNTER — Encounter (HOSPITAL_BASED_OUTPATIENT_CLINIC_OR_DEPARTMENT_OTHER): Payer: Self-pay | Admitting: Urology

## 2021-11-05 ENCOUNTER — Emergency Department (HOSPITAL_BASED_OUTPATIENT_CLINIC_OR_DEPARTMENT_OTHER)
Admission: EM | Admit: 2021-11-05 | Discharge: 2021-11-05 | Disposition: A | Discharge status: Home / Self Care | Payer: Medicare HMO | Attending: Emergency Medicine | Admitting: Emergency Medicine

## 2021-11-05 ENCOUNTER — Emergency Department (HOSPITAL_BASED_OUTPATIENT_CLINIC_OR_DEPARTMENT_OTHER): Payer: Medicare HMO

## 2021-11-05 DIAGNOSIS — E119 Type 2 diabetes mellitus without complications: Secondary | ICD-10-CM | POA: Insufficient documentation

## 2021-11-05 DIAGNOSIS — H538 Other visual disturbances: Secondary | ICD-10-CM | POA: Diagnosis not present

## 2021-11-05 DIAGNOSIS — Z79899 Other long term (current) drug therapy: Secondary | ICD-10-CM | POA: Insufficient documentation

## 2021-11-05 DIAGNOSIS — I10 Essential (primary) hypertension: Secondary | ICD-10-CM | POA: Diagnosis not present

## 2021-11-05 DIAGNOSIS — M542 Cervicalgia: Secondary | ICD-10-CM | POA: Diagnosis not present

## 2021-11-05 DIAGNOSIS — Y92410 Unspecified street and highway as the place of occurrence of the external cause: Secondary | ICD-10-CM | POA: Diagnosis not present

## 2021-11-05 DIAGNOSIS — R519 Headache, unspecified: Secondary | ICD-10-CM | POA: Diagnosis not present

## 2021-11-05 DIAGNOSIS — M549 Dorsalgia, unspecified: Secondary | ICD-10-CM | POA: Insufficient documentation

## 2021-11-05 MED ORDER — METHOCARBAMOL 500 MG PO TABS: 500.0000 mg | ORAL_TABLET | Freq: Two times a day (BID) | ORAL | 0 refills | Status: AC
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
## 2021-11-05 NOTE — Discharge Instructions (Addendum)
Diagnosed with back pain. Imaging of your head, neck and spine were reassuring. If back worsens, new urinary or bowel incontinence, new fever, or decreased sensation or range of motion of your extremities please return to the emergency department. For your pain, I ordered Robaxin which is a muscle relaxer which you can take as needed for pain. Otherwise, strongly recommend that you follow-up with your PCP in the next few days.
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
## 2024-04-26 ENCOUNTER — Emergency Department (HOSPITAL_BASED_OUTPATIENT_CLINIC_OR_DEPARTMENT_OTHER)
Admission: EM | Admit: 2024-04-26 | Discharge: 2024-04-26 | Disposition: A | Discharge status: Home / Self Care | Attending: Emergency Medicine | Admitting: Emergency Medicine

## 2024-04-26 ENCOUNTER — Encounter (HOSPITAL_BASED_OUTPATIENT_CLINIC_OR_DEPARTMENT_OTHER): Payer: Self-pay

## 2024-04-26 ENCOUNTER — Emergency Department (HOSPITAL_BASED_OUTPATIENT_CLINIC_OR_DEPARTMENT_OTHER)

## 2024-04-26 ENCOUNTER — Other Ambulatory Visit: Payer: Self-pay

## 2024-04-26 DIAGNOSIS — S93504A Unspecified sprain of right lesser toe(s), initial encounter: Secondary | ICD-10-CM | POA: Insufficient documentation

## 2024-04-26 DIAGNOSIS — Y9301 Activity, walking, marching and hiking: Secondary | ICD-10-CM | POA: Insufficient documentation

## 2024-04-26 DIAGNOSIS — S93509A Unspecified sprain of unspecified toe(s), initial encounter: Secondary | ICD-10-CM

## 2024-04-26 DIAGNOSIS — W231XXA Caught, crushed, jammed, or pinched between stationary objects, initial encounter: Secondary | ICD-10-CM | POA: Insufficient documentation

## 2024-04-26 MED ORDER — ACETAMINOPHEN 500 MG PO TABS
1000.0000 mg | ORAL_TABLET | Freq: Once | ORAL | Status: AC
  Administered 2024-04-26: 1000 mg via ORAL
  Filled 2024-04-26: qty 2

## 2024-04-26 NOTE — ED Notes (Signed)
 Discharge instructions reviewed with patient. Patient verbalizes understanding, no further questions at this time. Medications and follow up information provided. No acute distress noted at time of departure.

## 2024-04-26 NOTE — Discharge Instructions (Signed)
 Follow-up with your doctor if not improving.  Otherwise you may buddy tape your toes and use the postoperative shoe for pain/discomfort.  Return for any new or worsening symptoms.

## 2024-04-26 NOTE — ED Provider Notes (Signed)
 " [place] EMERGENCY DEPARTMENT AT [facility] [place] Provider Note   CSN: [id number] Arrival date & time: 04/26/24  1018     Patient presents with: Toe Injury   Anita Rodgers is a 54 y.o. female.   HPI 54 year old presents with right pinky toe injury.  2 days ago she was walking barefoot and her toe got caught on a door frame.  She has been having pain to her pinky toe and just proximal to this on the plantar aspect.  Is having a hard time putting her foot in her shoe due to the pain and walking due to the pain.  Feels like her toe might be a little swollen.  No proximal foot pain or ankle injury.  Took some ibuprofen  this morning.  Prior to Admission medications  Medication Sig Start Date End Date Taking? Authorizing Provider  acetaminophen  (TYLENOL ) 500 MG tablet Take 1,000 mg by mouth 2 (two) times daily as needed. For pain      [provider]  ALPRAZolam  (XANAX ) 0.5 MG tablet Take 0.5 mg by mouth every 12 (twelve) hours as needed. For anxiety     [provider]  amitriptyline  (ELAVIL ) 25 MG tablet Take 25 mg by mouth at bedtime.     [provider]  amLODipine  (NORVASC ) 5 MG tablet Take 5 mg by mouth daily.    [provider]  buPROPion HCl (WELLBUTRIN PO) Take by mouth.    [provider]  buPROPion HCl (WELLBUTRIN XL PO) Take by mouth.    [provider]  doxycycline  (VIBRAMYCIN ) 100 MG capsule One po bid x 7 days 11/04/17   Midge Golas, MD  Escitalopram  Oxalate (LEXAPRO  PO) Take 10 mg by mouth daily.     [provider]  EXFORGE  10-320 MG per tablet TAKE 1 TABLET BY MOUTH EVERY DAY**NEEDS APPOINTMENT FOR FURTHER REFILLS** 09/27/14   Hilty, Vinie BROCKS, MD  fluticasone  (FLONASE ) 50 MCG/ACT nasal spray Place 1 spray into the nose daily.      [provider]  furosemide  (LASIX ) 40 MG tablet Take 40 mg by mouth 2 (two) times daily.    [provider]  HYDROmorphone  (DILAUDID ) 4 MG tablet Take 4 mg  by mouth every 6 (six) hours as needed for pain.    [provider]  HYDROmorphone  HCl (EXALGO ) 8 MG T24A SR tablet Take 24 mg by mouth daily.    [provider]  HYDROMORPHONE  HCL ER PO Take 12 mg by mouth.     [provider]  HYDROMORPHONE  HCL ER PO Take 12 mg by mouth.    [provider]  HYDROXYZINE HCL PO Take by mouth.    [provider]  levothyroxine  (SYNTHROID , LEVOTHROID) 50 MCG tablet Take 50 mcg by mouth daily.    [provider]  loratadine  (CLARITIN ) 10 MG tablet Take 10 mg by mouth daily.      [provider]  losartan (COZAAR) 100 MG tablet Take 100 mg by mouth daily.    [provider]  Magnesium  250 MG TABS Take 1 tablet by mouth daily.    [provider]  metFORMIN  (GLUCOPHAGE -XR) 500 MG 24 hr tablet Take 1,000 mg by mouth 2 (two) times daily.     [provider]  methocarbamol  (ROBAXIN ) 500 MG tablet Take 1 tablet (500 mg total) by mouth 2 (two) times daily. 11/05/21   Robinson, John K, PA-C  modafinil (PROVIGIL) 200 MG tablet Take 200 mg by mouth daily.  [provider]  montelukast  (SINGULAIR ) 10 MG tablet Take 10 mg by mouth at bedtime.      [provider]  Multiple Vitamin (MULITIVITAMIN WITH MINERALS) TABS Take 1 tablet by mouth daily.    [provider]  Nebivolol HCl (BYSTOLIC) 20 MG TABS Take 1 tablet by mouth at bedtime.     [provider]  NON FORMULARY Take 4 tablets by mouth daily. Omega XL    [provider]  NON FORMULARY at bedtime. CPAP    [provider]  Polyethyl Glycol-Propyl Glycol (SYSTANE ULTRA) 0.4-0.3 % SOLN Place 1 drop into both eyes daily as needed. For dry eyes    [provider]  potassium chloride  SA (K-DUR,KLOR-CON ) 20 MEQ tablet Take 20 mEq by mouth daily.    [provider]  progesterone (PROMETRIUM) 200 MG capsule Take 200 mg by mouth at bedtime.     [provider]   spironolactone  (ALDACTONE ) 50 MG tablet Take 50 mg by mouth daily.    [provider]  SUMAtriptan (IMITREX) 50 MG tablet Take 50 mg by mouth once as needed. For migraine     [provider]    Allergies: Penicillins    Review of Systems  Musculoskeletal:  Positive for arthralgias.    Updated Vital Signs BP (!) 153/91   Pulse 77   Temp 98.1 F (36.7 C) (Oral)   Resp 16   SpO2 100%   Physical Exam Vitals and nursing note reviewed.  Constitutional:      Appearance: She is well-developed.  HENT:     Head: Normocephalic and atraumatic.  Cardiovascular:     Rate and Rhythm: Normal rate and regular rhythm.     Pulses:          Dorsalis pedis pulses are 2+ on the right side.  Pulmonary:     Effort: Pulmonary effort is normal.  Musculoskeletal:     Right ankle: No tenderness. Normal range of motion.     Right Achilles Tendon: No tenderness or defects.     Right foot: Tenderness present.       Feet:  Feet:     Comments: No deformity noted to the right foot/pinky toe.  No bruising or lacerations.  Tender to the entire fifth toe and just proximal. Skin:    General: Skin is warm and dry.  Neurological:     Mental Status: She is alert.     (all labs ordered are listed, but only abnormal results are displayed) Labs Reviewed - No data to display  EKG: None  Radiology: DG Foot Complete Right Result Date: 04/26/2024 EXAM: 3 VIEW(S) XRAY OF THE RIGHT FOOT 04/26/2024 10:43:00 AM COMPARISON: None available. CLINICAL HISTORY: foot injury Foot injury. FINDINGS: BONES AND JOINTS: There is no evidence of fracture. No malalignment. There is moderate calcaneal spurring at the insertion site of the achilles tendon. There are mild degenerative changes present within the midfoot. SOFT TISSUES: Unremarkable. IMPRESSION: 1. No acute fracture. 2. Moderate calcaneal spurring at the insertion site of the Achilles tendon. 3. Mild degenerative changes within the midfoot.  Electronically signed by: Evalene Coho MD 04/26/2024 10:58 AM EST RP Workstation: [id number]     Procedures   Medications Ordered in the ED  acetaminophen  (TYLENOL ) tablet 1,000 mg (1,000 mg Oral Given 04/26/24 1101)  Medical Decision Making Amount and/or Complexity of Data Reviewed Radiology: ordered and independent interpretation performed.    Details: No fracture  Risk OTC drugs.   Patient likely has a sprain from where her toe got pulled on the door.  She has no fracture seen on x-ray.  No bruising or subungual hematoma.  Neurovascularly intact.  Will buddy tape toes and give her a postoperative shoe.  Otherwise recommend OTC meds for pain control and ice.  Follow-up with PCP.     Final diagnoses:  Sprain of toe, initial encounter    ED Discharge Orders     None          Freddi Hamilton, MD 04/26/24 1124  "
